# Patient Record
Sex: Female | Born: 1965 | Hispanic: No
Health system: Southern US, Community
[De-identification: ages and names within clinical notes are randomized; demographics above are authoritative.]

---

## 2004-02-08 ENCOUNTER — Emergency Department: Payer: Self-pay | Admitting: Emergency Medicine

## 2004-02-09 ENCOUNTER — Encounter: Payer: Self-pay | Admitting: Neurology

## 2020-11-30 ENCOUNTER — Other Ambulatory Visit: Payer: Self-pay | Admitting: Neurology

## 2020-11-30 ENCOUNTER — Other Ambulatory Visit (HOSPITAL_COMMUNITY): Payer: Self-pay | Admitting: Neurology

## 2020-11-30 DIAGNOSIS — R200 Anesthesia of skin: Secondary | ICD-10-CM

## 2020-11-30 DIAGNOSIS — R202 Paresthesia of skin: Secondary | ICD-10-CM

## 2020-12-06 ENCOUNTER — Other Ambulatory Visit: Payer: Self-pay

## 2020-12-06 ENCOUNTER — Ambulatory Visit
Admission: RE | Admit: 2020-12-06 | Discharge: 2020-12-06 | Disposition: A | Discharge status: Home / Self Care | Payer: Medicare Other | Source: Ambulatory Visit | Attending: Neurology | Admitting: Neurology

## 2020-12-06 ENCOUNTER — Ambulatory Visit: Payer: Medicare Other

## 2020-12-06 DIAGNOSIS — R202 Paresthesia of skin: Secondary | ICD-10-CM | POA: Insufficient documentation

## 2020-12-06 DIAGNOSIS — R200 Anesthesia of skin: Secondary | ICD-10-CM | POA: Diagnosis present

## 2020-12-06 IMAGING — MR MR HEAD WO/W CM
13 of 14 series · 39 of 48 positions shown · IV contrast (gadavist)
Comparison: No pertinent prior exams available for comparison.

CLINICAL DATA: Numbness and tingling R20.0, [TL] ([TL]-CM).
Additional history provided: Left-sided leg weakness with dizziness
for 10 months, progressively worsening.

EXAM:
MRI HEAD WITHOUT AND WITH CONTRAST
MRI CERVICAL SPINE WITHOUT AND WITH CONTRAST
TECHNIQUE: Multiplanar, multiecho pulse sequences of the brain and surrounding
structures, and cervical spine, to include the craniocervical
junction and cervicothoracic junction, were obtained without and
with intravenous contrast.
CONTRAST:  7.5mL GADAVIST GADOBUTROL 1 MMOL/ML IV SOLN

[Series 5: ax dwi_tracew · axial · 3.0mm · 0.65mm/px · z∈[-81,+72]mm · 4 of 48 slices shown]
[im 1/48]
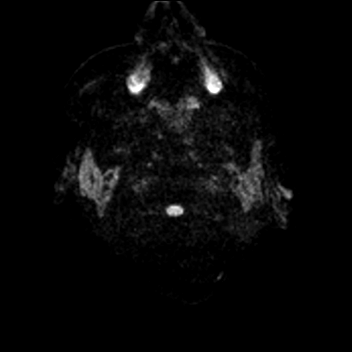
[im 16/48]
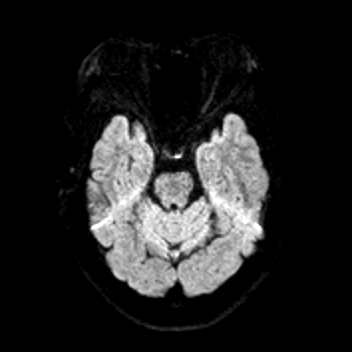
[im 32/48]
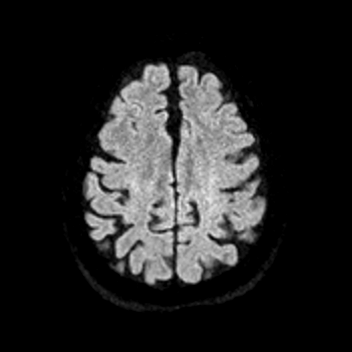
[im 48/48]
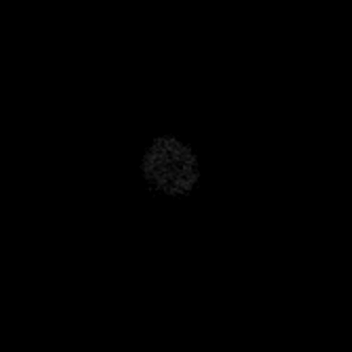

[Series 6: ax dwi_adc · axial · 3.0mm · 0.65mm/px · z∈[-81,+72]mm · 3 of 48 slices shown]
[im 1/48]
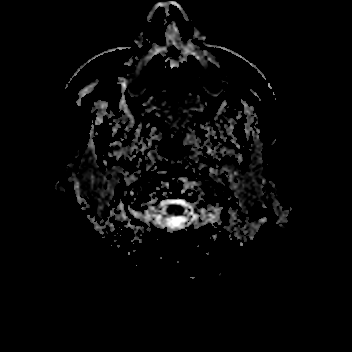
[im 24/48]
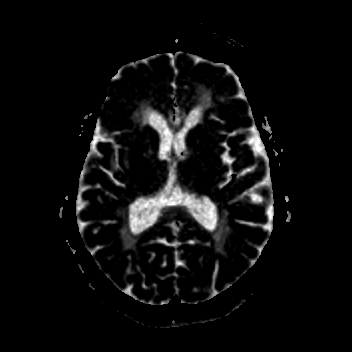
[im 48/48]
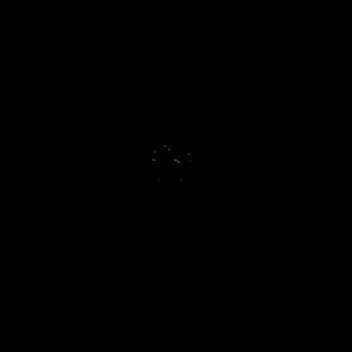

[Series 7: cor dwi_tracew · coronal · 5.0mm · 0.60mm/px · 2 of 38 slices shown]
[im 1/38]
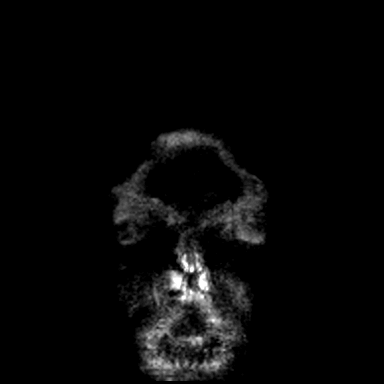
[im 38/38]
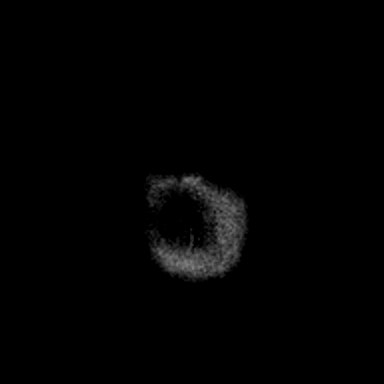

[Series 8: cor dwi_adc · coronal · 5.0mm · 0.60mm/px · 2 of 38 slices shown]
[im 1/38]
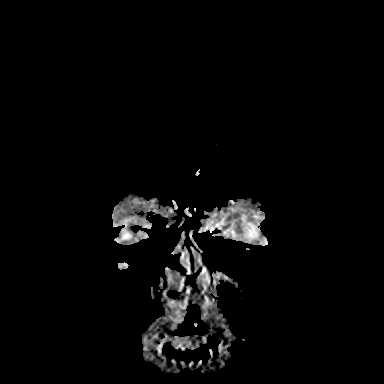
[im 38/38]
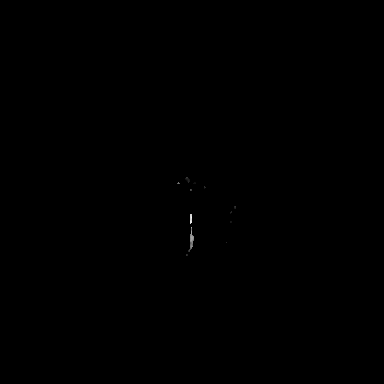

[Series 9: T1 · sagittal · 5.0mm · 0.62mm/px · 1 of 23 slices shown (1 of 2)]
[im 1/23]
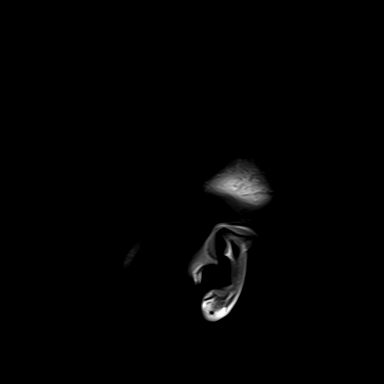

[Series 10: FLAIR · sagittal · 5.0mm · 0.94mm/px · 1 of 23 slices shown (1 of 2)]
[im 1/23]
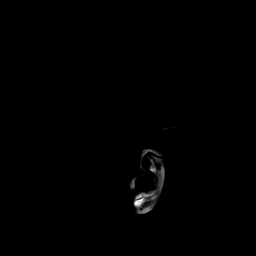

[Series 11: T2 · axial · 5.0mm · 0.53mm/px · z∈[-81,+73]mm · 2 of 27 slices shown]
[im 1/27]
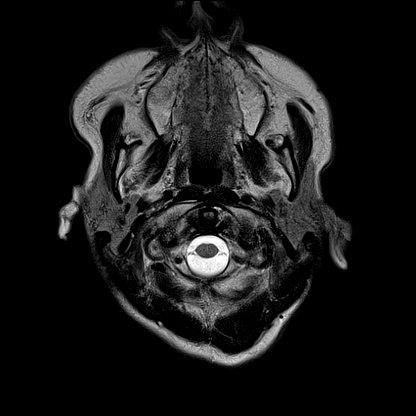
[im 27/27]
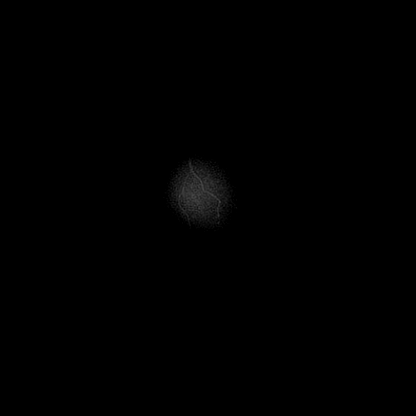

[Series 13: pha_images · axial · 3.0mm · 0.90mm/px · 1 of 52 slices shown]
[im 1/52]
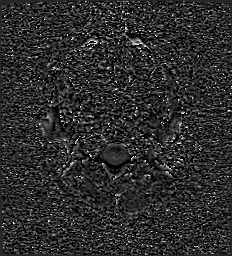

[Series 16: FLAIR · axial · 3.0mm · 0.53mm/px · z∈[-84,+76]mm · 3 of 55 slices shown (2 of 2)]
[im 1/55]
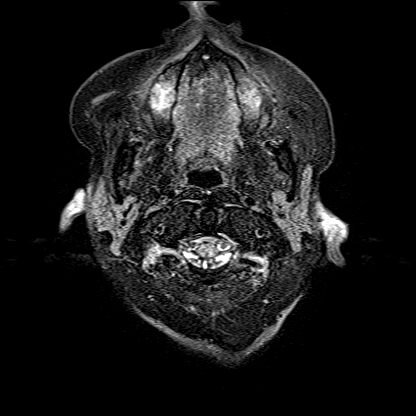
[im 28/55]
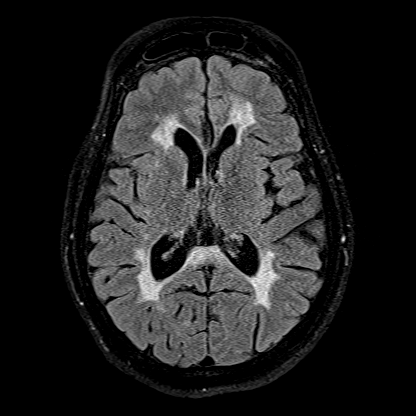
[im 55/55]
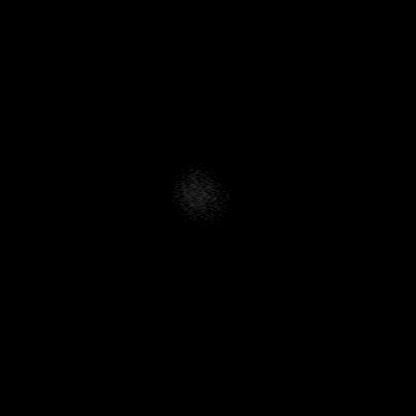

[Series 17: T1 · axial · 1.0mm · 0.98mm/px · z∈[-85,+72]mm · 8 of 160 slices shown (2 of 2)]
[im 1/160]
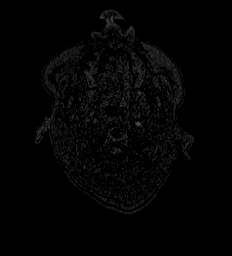
[im 18/160]
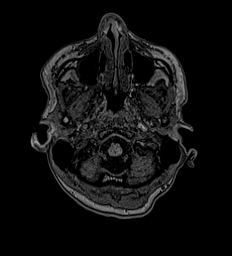
[im 54/160]
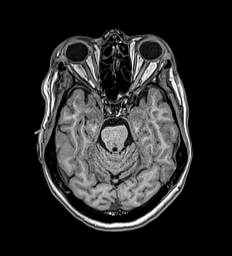
[im 71/160]
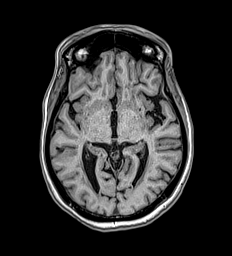
[im 89/160]
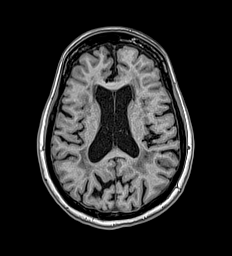
[im 107/160]
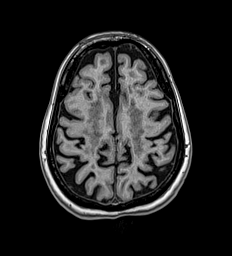
[im 142/160]
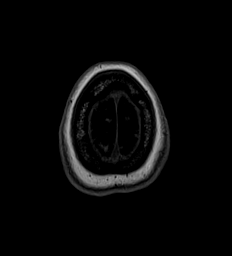
[im 160/160]
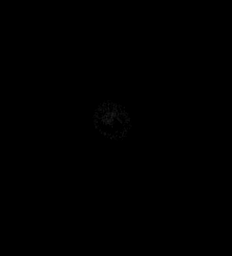

[Series 22: T2 post-contrast · coronal · 5.0mm · 0.57mm/px · 2 of 29 slices shown]
[im 1/29]
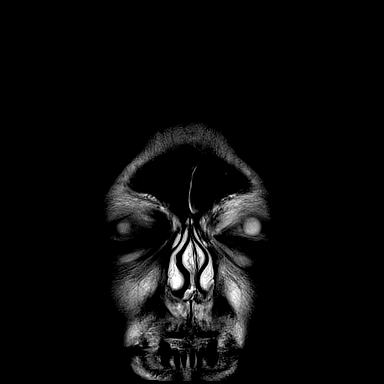
[im 29/29]
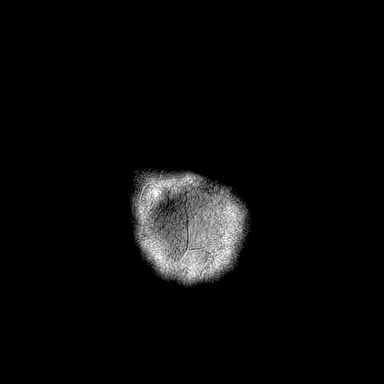

[Series 23: T1 post-contrast · axial · 1.0mm · 0.98mm/px · z∈[-85,+72]mm · 8 of 160 slices shown (1 of 2)]
[im 1/160]
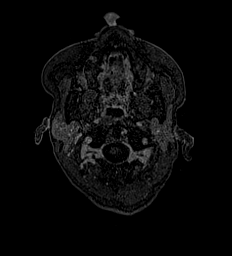
[im 18/160]
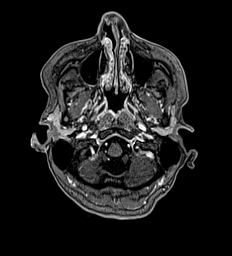
[im 54/160]
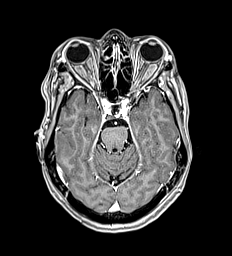
[im 71/160]
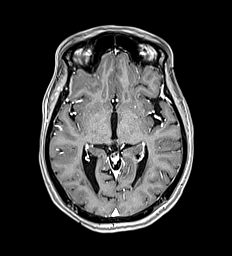
[im 89/160]
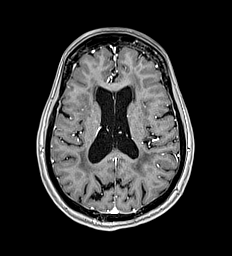
[im 107/160]
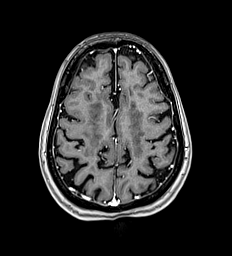
[im 142/160]
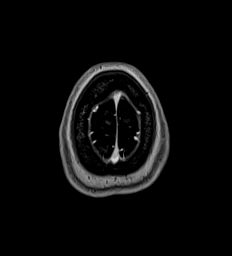
[im 160/160]
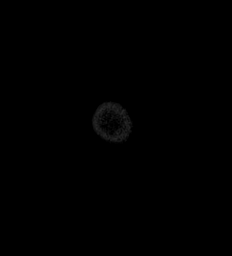

[Series 24: T1 post-contrast · coronal · 5.0mm · 0.57mm/px · 2 of 29 slices shown (2 of 2)]
[im 1/29]
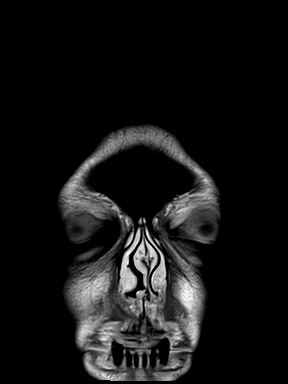
[im 29/29]
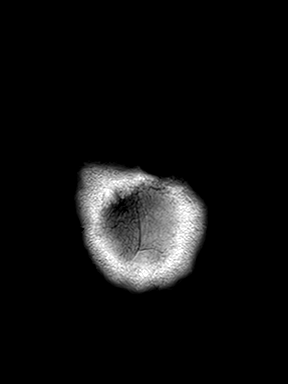

[39 of 48 positions shown; findings below may reference images not displayed]

FINDINGS: MRI HEAD FINDINGS

Brain:

Mild-to-moderate generalized cerebral atrophy, advanced for age.

Severe patchy and confluent T2/FLAIR hyperintensity within the
cerebral white matter. Multifocal T2/FLAIR hyperintense signal
abnormality is also present within the corpus callosum. Additional
subcentimeter focus of T2/FLAIR hyperintense signal abnormality
within the left cerebellar white matter (series 16, image 13).

There is no acute infarct.

No evidence of an intracranial mass.

No chronic intracranial blood products.

No extra-axial fluid collection.

No midline shift.

No abnormal intracranial enhancement.

Vascular: Expected proximal arterial flow voids.

Skull and upper cervical spine: No focal marrow lesion.

Sinuses/Orbits: Visualized orbits show no acute finding. Trace
bilateral frontal, ethmoid and maxillary sinus mucosal thickening.

MRI CERVICAL SPINE FINDINGS

Alignment: Cervical levocurvature. Mild reversal of the expected
cervical lordosis. Trace C4-C5 grade 1 anterolisthesis. Trace C5-C6
grade 1 retrolisthesis.

Vertebrae: Vertebral body height is maintained. Mild scattered edema
within the posterior elements, likely degenerative and related to
facet arthrosis. Elsewhere, no significant marrow edema or focal
suspicious osseous lesion is identified.

Cord: No spinal cord signal abnormality is identified. No abnormal
cord enhancement.

Posterior Fossa, vertebral arteries, paraspinal tissues: Posterior
fossa better assessed on concurrently performed brain MRI. Flow
voids preserved within the imaged cervical vertebral arteries.
Paraspinal soft tissues unremarkable.

Disc levels:

Moderate disc degeneration at C5-C6 and C6-C7. No more than mild
disc degeneration at the remaining levels.

C2-C3: Shallow disc bulge. Minimal facet arthrosis. No significant
spinal canal or foraminal stenosis.

C3-C4: Shallow disc bulge. Facet arthrosis. Uncovertebral
hypertrophy on the left. No significant spinal canal stenosis.
Minimal relative left neural foraminal narrowing.

C4-C5: Trace grade 1 anterolisthesis. Slight disc uncovering and
disc bulge. Uncovertebral hypertrophy on the right. Facet arthrosis.
No significant spinal canal or foraminal stenosis.

C5-C6: Disc bulge with right greater than left disc osteophyte
ridge/uncinate hypertrophy. Facet arthrosis. Mild relative spinal
canal narrowing without spinal cord mass effect. Moderate right
neural foraminal narrowing.

C6-C7: Posterior disc osteophyte complex with right greater than
left disc osteophyte ridge/uncinate hypertrophy. Minimal facet
arthrosis. Minimal partial effacement of the ventral thecal sac
without spinal cord mass effect. Severe right neural foraminal
narrowing.

C7-T1: Disc bulge eccentric to the right. Mild facet arthrosis. No
significant spinal canal stenosis. Mild relative right neural
foraminal narrowing.
IMPRESSION: MRI brain:

1. Severe patchy and confluent T2/FLAIR hyperintense signal changes
within the cerebral white matter. T2/FLAIR hyperintense signal
changes are also present within the corpus callosum and left
cerebellar white matter. Findings are nonspecific but suspicious for
a demyelinating process (such as multiple sclerosis). Markedly
age-advanced chronic small vessel ischemic disease is also a
consideration.
2. Mild-to-moderate generalized cerebral atrophy, advanced for age.

MRI cervical spine:

1. No appreciable signal abnormality within the cervical spinal
cord. No abnormal spinal cord enhancement.
2. Cervical spondylosis, as outlined.
3. No more than mild spinal canal stenosis.
4. Multilevel neural foraminal narrowing, as detailed and greatest
on the right at C5-C6 (moderate) and on the right at C6-C7 (severe).
5. Disc degeneration is greatest at C5-C6 and C6-C7 (moderate in
severity at these levels).
6. Reversal of the expected cervical lordosis.
7. Cervical levocurvature.
8. Trace multilevel grade 1 spondylolisthesis.

## 2020-12-06 IMAGING — MR MR CERVICAL SPINE WO/W CM
5 of 8 series · 26 of 48 positions shown · IV contrast (gadavist)
Comparison: No pertinent prior exams available for comparison.

CLINICAL DATA: Numbness and tingling R20.0, [TL] ([TL]-CM).
Additional history provided: Left-sided leg weakness with dizziness
for 10 months, progressively worsening.

EXAM:
MRI HEAD WITHOUT AND WITH CONTRAST
MRI CERVICAL SPINE WITHOUT AND WITH CONTRAST
TECHNIQUE: Multiplanar, multiecho pulse sequences of the brain and surrounding
structures, and cervical spine, to include the craniocervical
junction and cervicothoracic junction, were obtained without and
with intravenous contrast.
CONTRAST:  7.5mL GADAVIST GADOBUTROL 1 MMOL/ML IV SOLN

[Series 1: T2 · sagittal · 3.0mm · 0.62mm/px · 4 of 15 slices shown (1 of 2)]
[im 1/15]
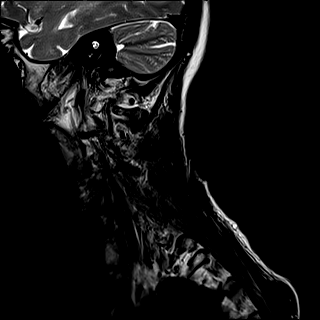
[im 5/15]
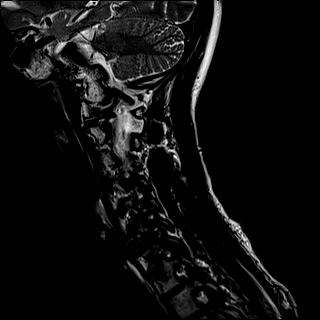
[im 10/15]
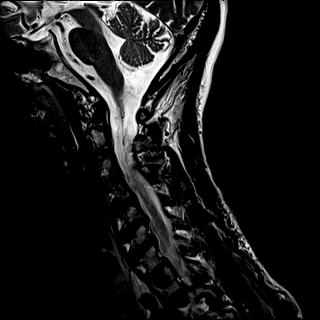
[im 15/15]
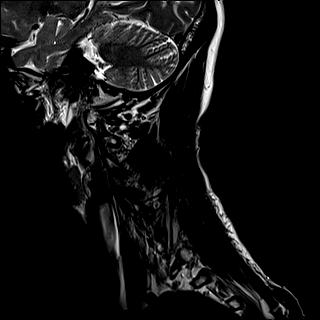

[Series 3: STIR · sagittal · 3.0mm · 0.62mm/px · 4 of 15 slices shown]
[im 1/15]
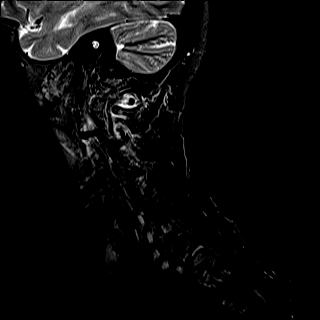
[im 5/15]
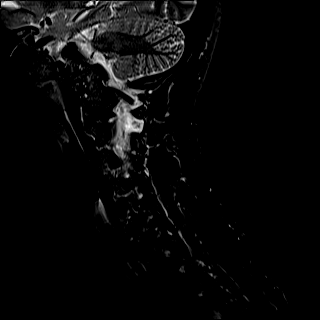
[im 10/15]
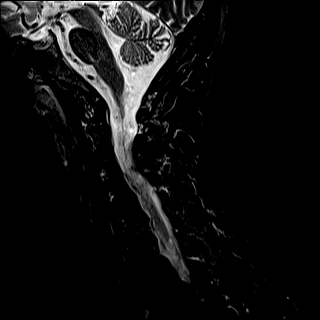
[im 15/15]
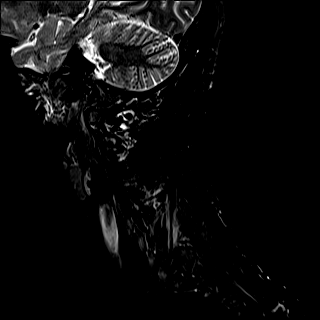

[Series 4: T2 · axial · 3.0mm · 0.70mm/px · z∈[-206,-124]mm · 8 of 29 slices shown (2 of 2)]
[im 1/29]
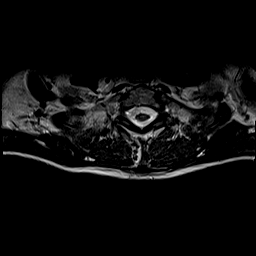
[im 5/29]
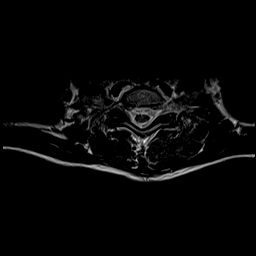
[im 9/29]
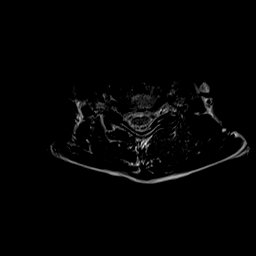
[im 13/29]
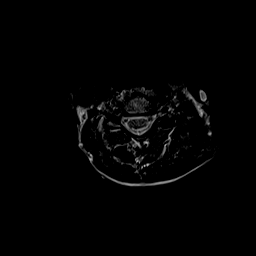
[im 17/29]
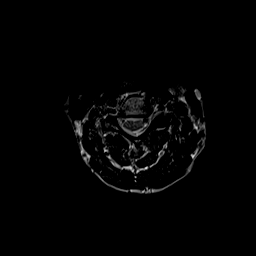
[im 21/29]
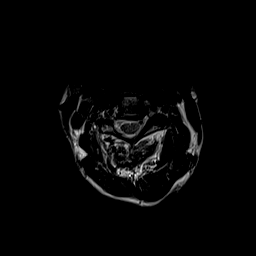
[im 25/29]
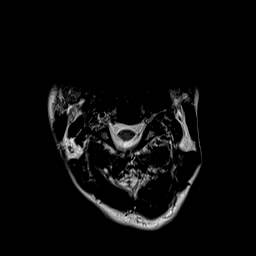
[im 29/29]
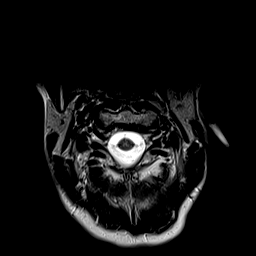

[Series 6: T1 · axial · non-contrast · 3.0mm · 0.35mm/px · z∈[-206,-124]mm · 8 of 29 slices shown]
[im 1/29]
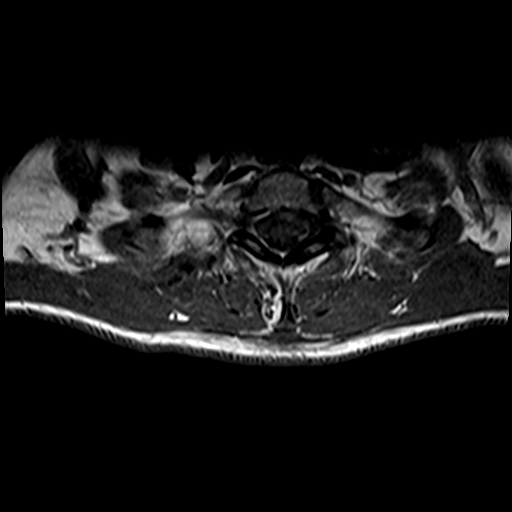
[im 5/29]
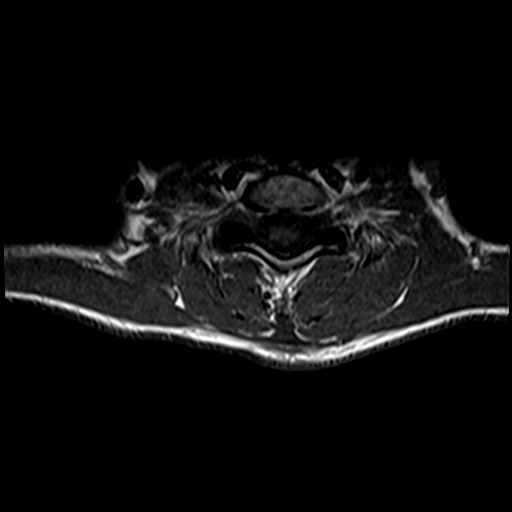
[im 9/29]
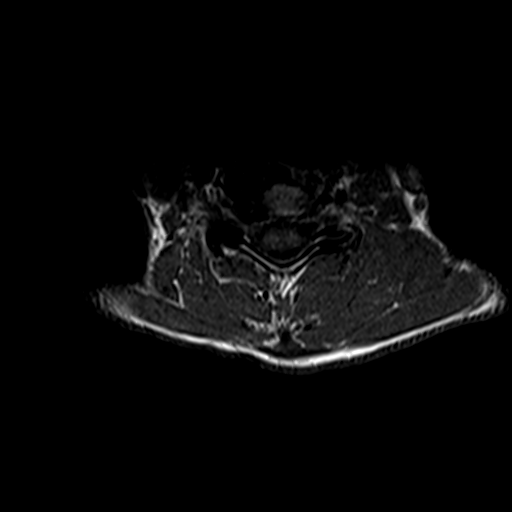
[im 13/29]
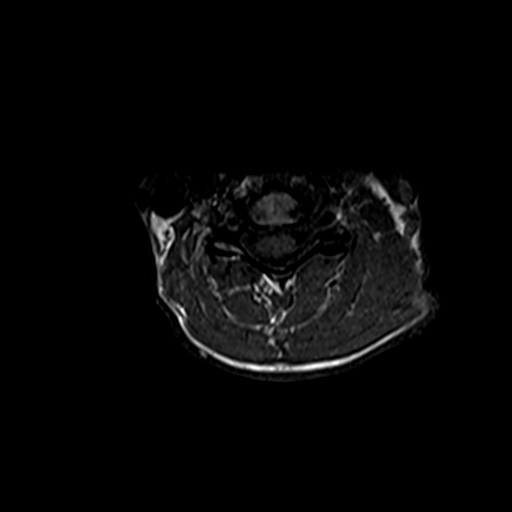
[im 17/29]
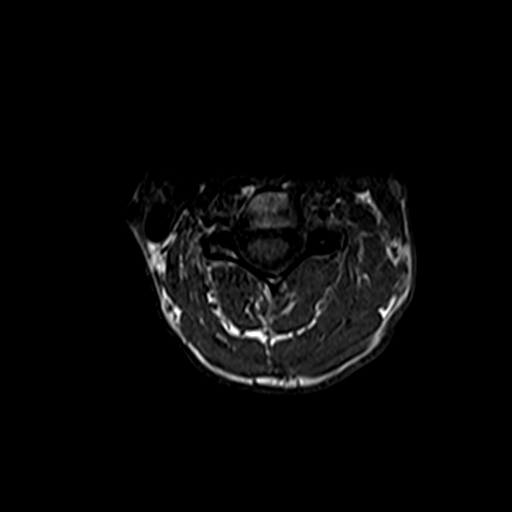
[im 21/29]
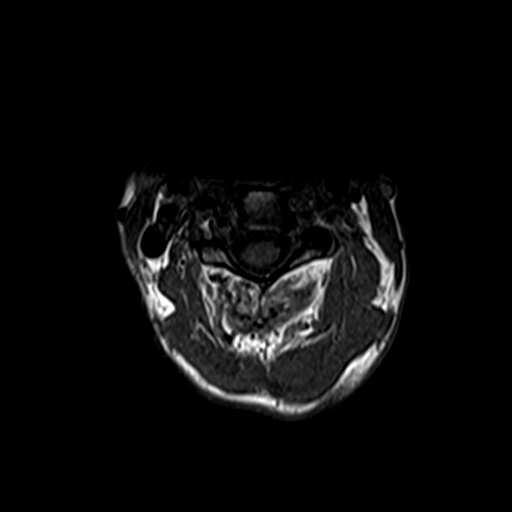
[im 25/29]
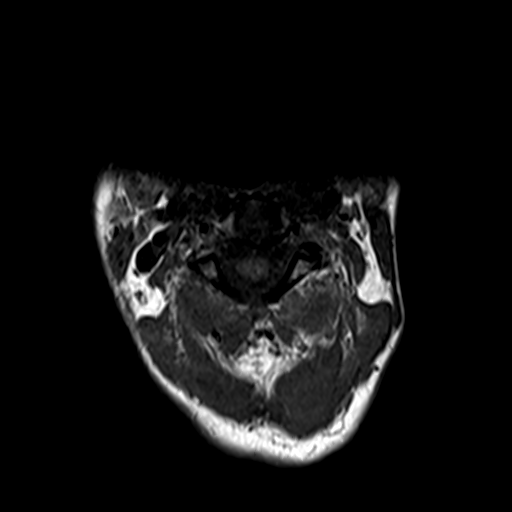
[im 29/29]
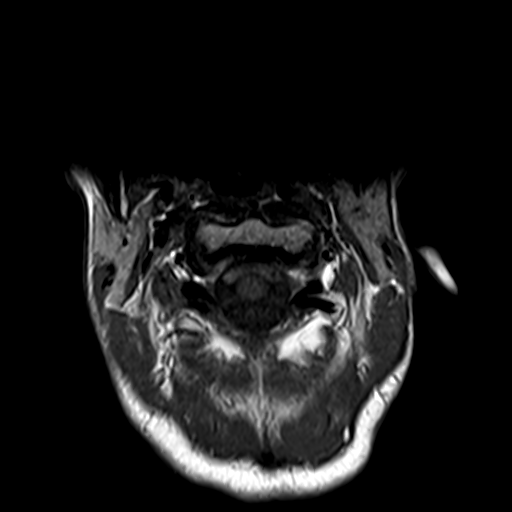

[Series 8: T1 post-contrast · axial · 3.0mm · 0.35mm/px · z∈[-206,-194]mm · 2 of 29 slices shown]
[im 1/29]
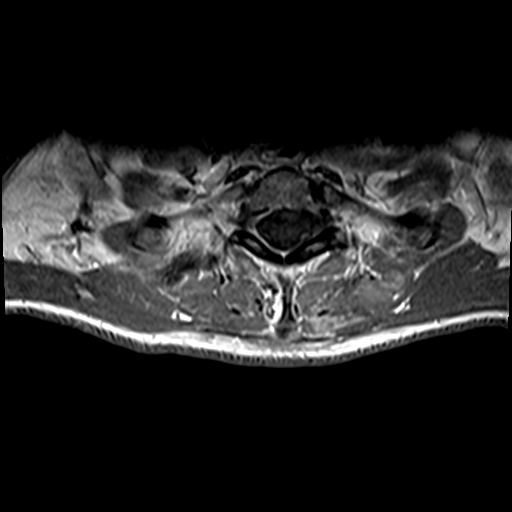
[im 5/29]
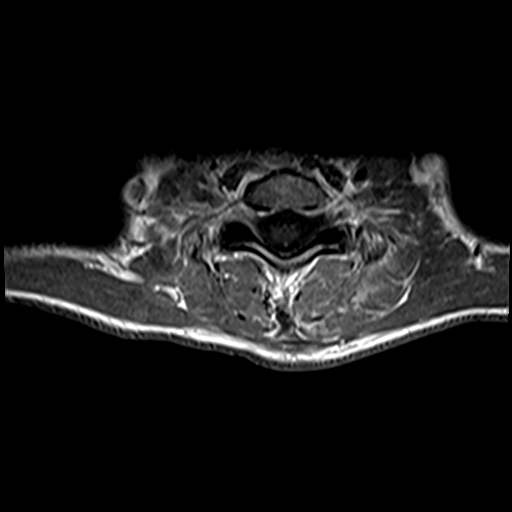

[26 of 48 positions shown; findings below may reference images not displayed]

FINDINGS: MRI HEAD FINDINGS

Brain:

Mild-to-moderate generalized cerebral atrophy, advanced for age.

Severe patchy and confluent T2/FLAIR hyperintensity within the
cerebral white matter. Multifocal T2/FLAIR hyperintense signal
abnormality is also present within the corpus callosum. Additional
subcentimeter focus of T2/FLAIR hyperintense signal abnormality
within the left cerebellar white matter (series 16, image 13).

There is no acute infarct.

No evidence of an intracranial mass.

No chronic intracranial blood products.

No extra-axial fluid collection.

No midline shift.

No abnormal intracranial enhancement.

Vascular: Expected proximal arterial flow voids.

Skull and upper cervical spine: No focal marrow lesion.

Sinuses/Orbits: Visualized orbits show no acute finding. Trace
bilateral frontal, ethmoid and maxillary sinus mucosal thickening.

MRI CERVICAL SPINE FINDINGS

Alignment: Cervical levocurvature. Mild reversal of the expected
cervical lordosis. Trace C4-C5 grade 1 anterolisthesis. Trace C5-C6
grade 1 retrolisthesis.

Vertebrae: Vertebral body height is maintained. Mild scattered edema
within the posterior elements, likely degenerative and related to
facet arthrosis. Elsewhere, no significant marrow edema or focal
suspicious osseous lesion is identified.

Cord: No spinal cord signal abnormality is identified. No abnormal
cord enhancement.

Posterior Fossa, vertebral arteries, paraspinal tissues: Posterior
fossa better assessed on concurrently performed brain MRI. Flow
voids preserved within the imaged cervical vertebral arteries.
Paraspinal soft tissues unremarkable.

Disc levels:

Moderate disc degeneration at C5-C6 and C6-C7. No more than mild
disc degeneration at the remaining levels.

C2-C3: Shallow disc bulge. Minimal facet arthrosis. No significant
spinal canal or foraminal stenosis.

C3-C4: Shallow disc bulge. Facet arthrosis. Uncovertebral
hypertrophy on the left. No significant spinal canal stenosis.
Minimal relative left neural foraminal narrowing.

C4-C5: Trace grade 1 anterolisthesis. Slight disc uncovering and
disc bulge. Uncovertebral hypertrophy on the right. Facet arthrosis.
No significant spinal canal or foraminal stenosis.

C5-C6: Disc bulge with right greater than left disc osteophyte
ridge/uncinate hypertrophy. Facet arthrosis. Mild relative spinal
canal narrowing without spinal cord mass effect. Moderate right
neural foraminal narrowing.

C6-C7: Posterior disc osteophyte complex with right greater than
left disc osteophyte ridge/uncinate hypertrophy. Minimal facet
arthrosis. Minimal partial effacement of the ventral thecal sac
without spinal cord mass effect. Severe right neural foraminal
narrowing.

C7-T1: Disc bulge eccentric to the right. Mild facet arthrosis. No
significant spinal canal stenosis. Mild relative right neural
foraminal narrowing.
IMPRESSION: MRI brain:

1. Severe patchy and confluent T2/FLAIR hyperintense signal changes
within the cerebral white matter. T2/FLAIR hyperintense signal
changes are also present within the corpus callosum and left
cerebellar white matter. Findings are nonspecific but suspicious for
a demyelinating process (such as multiple sclerosis). Markedly
age-advanced chronic small vessel ischemic disease is also a
consideration.
2. Mild-to-moderate generalized cerebral atrophy, advanced for age.

MRI cervical spine:

1. No appreciable signal abnormality within the cervical spinal
cord. No abnormal spinal cord enhancement.
2. Cervical spondylosis, as outlined.
3. No more than mild spinal canal stenosis.
4. Multilevel neural foraminal narrowing, as detailed and greatest
on the right at C5-C6 (moderate) and on the right at C6-C7 (severe).
5. Disc degeneration is greatest at C5-C6 and C6-C7 (moderate in
severity at these levels).
6. Reversal of the expected cervical lordosis.
7. Cervical levocurvature.
8. Trace multilevel grade 1 spondylolisthesis.

## 2020-12-06 MED ORDER — GADOBUTROL 1 MMOL/ML IV SOLN
5.0000 mL | Freq: Once | INTRAVENOUS | Status: AC | PRN
  Administered 2020-12-06: 7.5 mL via INTRAVENOUS

## 2022-04-28 ENCOUNTER — Ambulatory Visit: Payer: Medicare Other | Admitting: Physical Therapy

## 2022-05-10 ENCOUNTER — Encounter: Payer: Non-veteran care | Admitting: Physical Therapy

## 2022-05-16 ENCOUNTER — Telehealth: Payer: Self-pay | Admitting: Physical Therapy

## 2022-05-16 ENCOUNTER — Ambulatory Visit: Payer: Medicare Other | Admitting: Physical Therapy

## 2022-05-16 NOTE — Telephone Encounter (Signed)
Called patient when she did not show up for her appointment scheduled at 11:15 this morning. Left VM notifying patient of missed visit and requesting a call back to reschedule.   Stacey Jenkins. Graylon Good, PT, DPT 05/16/22, 11:33 AM  [facility] [address] [place], [place] [address] P: [phone] I F: [phone]

## 2022-05-17 ENCOUNTER — Ambulatory Visit: Payer: Medicare Other | Admitting: Physical Therapy

## 2022-05-17 ENCOUNTER — Encounter: Payer: Non-veteran care | Admitting: Physical Therapy

## 2022-05-18 ENCOUNTER — Ambulatory Visit: Payer: Medicare Other | Attending: Neurology

## 2022-05-18 ENCOUNTER — Ambulatory Visit: Payer: Medicare Other | Admitting: Physical Therapy

## 2022-05-18 DIAGNOSIS — R2681 Unsteadiness on feet: Secondary | ICD-10-CM | POA: Diagnosis present

## 2022-05-18 DIAGNOSIS — R262 Difficulty in walking, not elsewhere classified: Secondary | ICD-10-CM | POA: Insufficient documentation

## 2022-05-18 NOTE — Therapy (Signed)
[facility] [address] [place], [address] Phone: [phone]   Fax:  [phone]  Physical Therapy Evaluation  Patient Details  Name: Stacey Jenkins MRN: [id number] Date of Birth: 1965/11/26 Referring Provider (PT): Jennings Books, MD (Neurology)   Encounter Date: 05/18/2022   PT End of Session - 05/18/22 1138     Visit Number 1    Number of Visits 12    Date for PT Re-Evaluation 06/29/22    Authorization Type [employer] Medicare    Authorization Time Period 05/18/22-06/29/22    Progress Note Due on Visit 10    PT Start Time 1115    PT Stop Time 1200    PT Time Calculation (min) 45 min    Equipment Utilized During Treatment Gait belt    Activity Tolerance Patient tolerated treatment well;No increased pain;Patient limited by fatigue    Behavior During Therapy [facility] for tasks assessed/performed             History reviewed. No pertinent past medical history.  History reviewed. No pertinent surgical history.  There were no vitals filed for this visit.    Subjective Assessment -      Subjective Stacey Jenkins is a 57yoF referred to OPPT for intermittent knee pain, chronic gait/balance impairment related to MS diagnosis. She as a history of MS >25 years with mostly LLE limitations.    Pertinent History [name] is a 57yoF diagnosed with MS at age 12, has some intermittent diplopia, some LUE paresthesia, LLE motor and sensory involvement mostly, used [facility] for several years, uses [facility] or [facility] as needed. Pt still drives, AMB in community, has local family support, no recent falls due to safety awareness and risk awareness. 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    Currently in Pain? No/denies   knee hurts with weather changes, typically feels better with A/ROM/ mobility               [facility] PT Assessment -       Assessment   Medical Diagnosis Left knee pain, LLE weakness in MS     Referring Provider (PT) Jennings Books, MD (Neurology)    Hand Dominance Right      Restrictions   Weight Bearing Restrictions No      Balance Screen   Has the patient fallen in the past 6 months No    Has the patient had a decrease in activity level because of a fear of falling?  No    Is the patient reluctant to leave their home because of a fear of falling?  No      Prior Function   Level of Independence Independent    Vocation On disability      Transfers   Five time sit to stand comments  15.51sec   hands free     Ambulation/Gait   Ambulation Distance (Feet) 330 Feet    Assistive device Large base quad cane    Gait Pattern Shuffle;Abducted - left   step-to gait, 2 point gait c RUE QC   Pre-Gait Activities 6MWT   consistent pace, no pain, no fatigue, no dyspnea     Standardized Balance Assessment   Standardized Balance Assessment Mini-BESTest;Timed Up and Go Test      Mini-BESTest   Sit To Stand Normal: Comes to stand without use of hands and stabilizes independently.    Rise to Toes Moderate: Heels  up, but not full range (smaller than when holding hands), OR noticeable instability for 3 s.    Stand on one leg (left) Severe: Unable    Stand on one leg (right) Severe: Unable    Stand on one leg - lowest score 0    Compensatory Stepping Correction - Forward No step, OR would fall if not caught, OR falls spontaneously.    Compensatory Stepping Correction - Backward No step, OR would fall if not caught, OR falls spontaneously.    Compensatory Stepping Correction - Left Lateral Severe: Falls, or cannot step    Compensatory Stepping Correction - Right Lateral Severe:  Falls, or cannot step    Stepping Corredtion Lateral - lowest score 0    Stance - Feet together, eyes open, firm surface  Normal: 30s    Stance - Feet together, eyes closed, foam surface  Normal: 30s   takes time to get established   Incline - Eyes Closed Moderate: Stands independently < 30s OR aligns with surface     Change in Gait Speed Severe: Unable to achieve significant change in walking speed AND signs of imbalance.    Walk with head turns - Horizontal Severe: performs head turns with imbalance    Walk with pivot turns Moderate:Turns with feet close SLOW (>4 steps) with good balance.    Step over obstacles Moderate: Steps over box but touches box OR displays cautious behavior by slowing gait.    Timed UP & GO with Dual Task Moderate: Dual Task affects either counting OR walking (>10%) when compared to the TUG without Dual Task.    Mini-BEST total score 11      Timed Up and Go Test   Normal TUG (seconds) 23.86    Cognitive TUG (seconds) 33.95   month sof year backwards               Objective measurements completed on examination: See above findings.       PT Education -     Education Details Role of PT in promoting basic actiivty    Person(s) Educated Patient    Methods Explanation    Comprehension Verbalized understanding;Returned demonstration              PT Short Term Goals -      PT SHORT TERM GOAL #1   Title Patient to demonstrate adherence to HEP for improved and community ambulation.    Baseline 05/18/2022: HEP assignment pending    Time 4    Period Weeks    Status New    Target Date 06/15/22      PT SHORT TERM GOAL #2   Title Patient demonstrated 5 times sit to stand in less than 13 seconds and 3    Baseline 05/18/2022: 15 seconds without hands    Time 4    Period Weeks    Status New    Target Date 06/15/22               PT Long Term Goals -      PT LONG TERM GOAL #1   Title Patient to demonstrate 3 point met or greater on mini best test to indicate reduced fall risk    Baseline 05/18/22: 11/28    Time 6    Period Weeks    Status New    Target Date 06/29/22      PT LONG TERM GOAL #2   Title Patient to demonstrate improved tug test less than 18 seconds to  indicate based risk of falls    Baseline Eval: 24 seconds no device    Time 6     Period Weeks    Status New    Target Date 06/29/22      PT LONG TERM GOAL #3   Title Patient to demonstrate 10 m walk test greater than 0.5 meters/sec to indicate treatment in stride length and reduction of shuffling    Baseline 05/18/22: 0.28 m/s    Time 6    Period Weeks    Status New    Target Date 06/29/22      PT LONG TERM GOAL #4   Title Patient did demonstrate 6-minute walk test distance greater than 500 feet to demonstrate improved capacity related in the community    Baseline eval: 347ft c RUE LBQC    Time 6    Period Weeks    Status New    Target Date 06/29/22                 Plan -     Clinical Impression Statement Pt reports to be near her recent baseline. Examination revealing of significant motor pattern deviations and balance deficits that empirically point to elevated risk of sustaining a fall, as well as difficulty performing mobility in the community in a safe capacity. Pt has done well to manage this risk through safe choices, activity modification, and use of DME when needed. Pt will benefit from skilled PT intervention to improve ability to perform IADL mobility and to reduce risk of falls.    Personal Factors and Comorbidities Past/Current Experience;Behavior Pattern;Time since onset of injury/illness/exacerbation    Examination-Activity Limitations Transfers;Squat;Stand    Examination-Participation Restrictions Community Activity;Driving    Stability/Clinical Decision Making Evolving/Moderate complexity    Clinical Decision Making High    Rehab Potential Excellent    PT Frequency 2x / week    PT Duration 6 weeks    PT Treatment/Interventions ADLs/Self Care Home Management;Electrical Stimulation;Cryotherapy;Moist Heat;Gait training;DME Instruction;Functional mobility training;Therapeutic activities;Therapeutic exercise;Balance training;Neuromuscular re-education;Patient/family education;Cognitive remediation;Prosthetic Training    PT Next Visit Plan  Additional tests and measures as indicated; static and dynamic balance interventions, establish exercises for home    PT Home Exercise Plan defer until able to be determined safe here in clinic    Consulted and Agree with Plan of Care Patient             Patient will benefit from skilled therapeutic intervention in order to improve the following deficits and impairments:  Abnormal gait, Decreased balance, Decreased mobility, Increased muscle spasms, Prosthetic Dependency, Improper body mechanics, Decreased range of motion, Decreased strength, Postural dysfunction  Visit Diagnosis: Difficulty in walking, not elsewhere classified - Plan: PT plan of care cert/re-cert  Unsteadiness on feet - Plan: PT plan of care cert/re-cert     Problem List There are no problems to display for this patient.  12:47 PM, 05/18/22 Rosamaria Lints, PT, DPT Physical Therapist - [place] [phone] (Office)   Milladore C, PT 05/18/2022, 12:47 PM  [facility] [address] [place], [address] Phone: [phone]   Fax:  [phone]  Name: Aleeha Boline Broussard MRN: [id number] Date of Birth: Aug 31, 1965

## 2022-05-24 ENCOUNTER — Ambulatory Visit: Payer: Medicare Other | Admitting: Physical Therapy

## 2022-05-24 ENCOUNTER — Encounter: Payer: Self-pay | Admitting: Physical Therapy

## 2022-05-24 VITALS — BP 145/82 | HR 58

## 2022-05-24 DIAGNOSIS — R2681 Unsteadiness on feet: Secondary | ICD-10-CM

## 2022-05-24 DIAGNOSIS — R262 Difficulty in walking, not elsewhere classified: Secondary | ICD-10-CM

## 2022-05-24 NOTE — Therapy (Signed)
OUTPATIENT PHYSICAL THERAPY TREATMENT NOTE   Patient Name: Stacey Jenkins MRN: [id number] DOB:[birth year], 57 y.o., female Today's Date: 05/24/2022  PCP: [place] PROVIDER: Jennings Books, MD (neurology)  END OF SESSION:  PT End of Session - 05/24/22 1723     Visit Number 2    Number of Visits 12    Date for PT Re-Evaluation 06/29/22    Authorization Type [employer] Medicare reporting period from 05/18/2022    Progress Note Due on Visit 10    PT Start Time 1600    PT Stop Time 1645    PT Time Calculation (min) 45 min    Equipment Utilized During Treatment Gait belt    Activity Tolerance Patient tolerated treatment well;No increased pain    Behavior During Therapy [facility] for tasks assessed/performed             History reviewed. No pertinent past medical history. History reviewed. No pertinent surgical history. There are no problems to display for this patient.   REFERRING DIAG: chronic pain in right knee (MD note says order Physical Therapy for ambulation/balance in a patient with multiple sclerosis).   THERAPY DIAG:  Difficulty in walking, not elsewhere classified  Unsteadiness on feet  Rationale for Evaluation and Treatment: Rehabilitation  PERTINENT HISTORY: Stacey Jenkins is a 55yoF diagnosed with MS at age 48, has some intermittent diplopia, some LUE paresthesia, LLE motor and sensory involvement mostly, used spring leaf AFO for several years, uses [facility] or [facility] as needed. Pt still drives, AMB in community, has local family support, no recent falls due to safety awareness and risk awareness. 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   SUBJECTIVE:   SUBJECTIVE STATEMENT: Patient reports she is feeling well today. She arrives with her quad cane. Reports no falls since last PT session. She likes to read and look at TV and movies for fun. She also likes to go out to eat. She would like to get walking better as her goal for  PT.   PAIN:  Are you having pain? NPRS 0/10  OBJECTIVE  Vitals:   05/24/22 1605  BP: (!) 145/82  Pulse: (!) 58  SpO2: 100%   OBSERVATION Patient holds head in rigtht sidebending and rotation.  Gait shuffling and short steps with quad cane L knee hyperextends to approx 20 degrees recurvatum that patient stands in passively, but she is able to stablize L knee with slight flexion when cued.   CERVICAL SPINE ROM AROM very limited in left rotation and left sidebending PROM WFL in left rotation and sidebending.   MUSCLE PERFORMANCE (MMT):  *Indicates pain 05/24/22 Date Date  Joint/Motion R/L R/L R/L  Hip     Flexion (L1, L2) 4+/3 / /  Extension (knee ext) / / /  Extension (knee flex) / / /  Abduction (seated) 4+/3+ / /  Knee     Extension (L3) 5/4+ / /  Flexion (S2) 4/4 / /  Ankle/Foot     Dorsiflexion (L4) 4+/4 / /  Plantarflexion (S1) 4+/4+ / /  Comments:  05/24/22: seated and with left AFO (springleaf) donned.   TODAY'S TREATMENT:   Therapeutic exercise: to centralize symptoms and improve ROM, strength, muscular endurance, and activity tolerance required for successful completion of functional activities.  - vitals measurement to get baseline prior to exercise (see above).  - NuStep level 4 using bilateral upper and lower extremities. Seat/handle setting 11/14. For improved  extremity mobility, muscular endurance, and activity tolerance; and to induce the analgesic effect of aerobic exercise, stimulate improved joint nutrition, and prepare body structures and systems for following interventions. x 6  minutes. Average SPM = 54. - sit to stand from 18 inch chair, 1x10 - sit to stand from 18 inch chair with overhead press, 2x10 with 4# DB in each hand.  - lateral stepping back and forth over small aerobic step with B UE support, 2x10 progressing to looking at feet less. SBA with gait belt on.  - forwards/backwards step over small aerobic step with U to B UE support, 1x10 each  foot forwards. CGA.  - seated cervical retraction and left cervical side bend, 1x10.  - Education on HEP including handout   Pt required multimodal cuing for proper technique and to facilitate improved neuromuscular control, strength, range of motion, and functional ability resulting in improved performance and form.  PATIENT EDUCATION: Education details: Exercise purpose/form. Self management techniques. Education on HEP including handout. Person educated: Patient Education method: Explanation, Demonstration, Tactile cues, Verbal cues, and Handouts Education comprehension: verbalized understanding, returned demonstration, verbal cues required, tactile cues required, and needs further education  HOME EXERCISE PROGRAM: Access Code: [email or web address] URL: [email or web address] Date: 05/24/2022 Prepared by: Norton Blizzard  Exercises - Sit to Stand Without Arm Support  - 1 x daily - 3 sets - 10 reps - Side Stepping with Counter Support  - 1 x daily - 3 sets - 10 reps - Seated Neck Sidebending ROM  - 1 x daily - 3 sets - 10 reps   PT Short Term Goals       PT SHORT TERM GOAL #1   Title Patient to demonstrate adherence to HEP for improved and community ambulation.    Baseline HEP assignment pending (05/18/2022); initial HEP provided at visit 2 (05/24/2022);    Time 4    Period Weeks    Status In-progress   Target Date 06/15/22      PT SHORT TERM GOAL #2   Title Patient demonstrated 5 times sit to stand in less than 13 seconds and 3    Baseline 15 seconds without hands (05/18/2022);   Time 4    Period  Weeks   Status In-progress   Target Date 06/15/22              PT Long Term Goals       PT LONG TERM GOAL #1   Title Patient to demonstrate 3 point met or greater on mini best test to indicate reduced fall risk    Baseline 11/28 (05/18/22);   Time 6    Period Weeks    Status In-progress   Target Date 06/29/22      PT LONG TERM GOAL #2   Title Patient to demonstrate  improved tug test less than 18 seconds to indicate based risk of falls    Baseline Eval: 24 seconds no device (05/18/22);   Time 6    Period Weeks    Status In progress   Target Date 06/29/22      PT LONG TERM GOAL #3   Title Patient to demonstrate 10 m walk test greater than 0.5 meters/sec to indicate treatment in stride length and reduction of shuffling    Baseline 0.28 m/s  (05/18/22);   Time 6    Period Weeks    Status In-progress   Target Date 06/29/22      PT LONG TERM GOAL #4   Title Patient did demonstrate  6-minute walk test distance greater than 500 feet to demonstrate improved capacity related in the community    Baseline eval: 326ft c RUE [facility]  (05/18/22);   Time 6    Period Weeks    Status In-progress   Target Date 06/29/22           Plan    Clinical Impression Statement Patient arrived with very slow shuffling gait with quad cane. However, she demonstrated much better than expected LE strength and ability to take large steps during the session. Patient provided with appropriate HEP. Pateint has significant passive recurvatum in L knee and might benefit from a brace to help limit further stretching out of the posterior knee capsule that could be related to worsening knee pain. Patient would benefit from continued management of limiting condition by skilled physical therapist to address remaining impairments and functional limitations to work towards stated goals and return to PLOF or maximal functional independence.     Personal Factors and Comorbidities Past/Current Experience;Behavior Pattern;Time since onset of injury/illness/exacerbation    Examination-Activity Limitations Transfers;Squat;Stand    Examination-Participation Restrictions Community Activity;Driving    Stability/Clinical Decision Making Evolving/Moderate complexity    Rehab Potential Excellent    PT Frequency 2x / week    PT Duration 6 weeks    PT Treatment/Interventions ADLs/Self Care Home  Management;Electrical Stimulation;Cryotherapy;Moist Heat;Gait training;DME Instruction;Functional mobility training;Therapeutic activities;Therapeutic exercise;Balance training;Neuromuscular re-education;Patient/family education;Cognitive remediation;Prosthetic Training    PT Next Visit Plan Additional tests and measures as indicated; static and dynamic balance interventions, establish exercises for home    PT Home Exercise Plan defer until able to be determined safe here in clinic    Consulted and Agree with Plan of Care Patient             Nancy Nordmann, PT, DPT 05/24/2022, 6:10 PM  [facility] [address] [place], [place] [address] P: [phone] I F: [phone]

## 2022-05-30 ENCOUNTER — Ambulatory Visit: Payer: Medicare Other | Admitting: Physical Therapy

## 2022-05-30 ENCOUNTER — Encounter: Payer: Self-pay | Admitting: Physical Therapy

## 2022-05-30 DIAGNOSIS — R262 Difficulty in walking, not elsewhere classified: Secondary | ICD-10-CM | POA: Diagnosis not present

## 2022-05-30 DIAGNOSIS — R2681 Unsteadiness on feet: Secondary | ICD-10-CM

## 2022-05-30 NOTE — Therapy (Signed)
OUTPATIENT PHYSICAL THERAPY TREATMENT NOTE   Patient Name: Stacey Jenkins MRN: [id number] DOB:Apr 03, 1966, 57 y.o., female Today's Date: 05/30/2022  PCP: [place] PROVIDER: Jennings Books, MD (neurology)  END OF SESSION:  PT End of Session - 05/30/22 1136     Visit Number 3    Number of Visits 12    Date for PT Re-Evaluation 06/29/22    Authorization Type [employer] Medicare reporting period from 05/18/2022    Progress Note Due on Visit 10    PT Start Time 1030    PT Stop Time 1115    PT Time Calculation (min) 45 min    Equipment Utilized During Treatment Gait belt    Activity Tolerance Patient tolerated treatment well;No increased pain    Behavior During Therapy [facility] for tasks assessed/performed             History reviewed. No pertinent past medical history. History reviewed. No pertinent surgical history. There are no problems to display for this patient.   REFERRING DIAG: chronic pain in right knee (MD note says order Physical Therapy for ambulation/balance in a patient with multiple sclerosis).   THERAPY DIAG:  Difficulty in walking, not elsewhere classified  Unsteadiness on feet  Rationale for Evaluation and Treatment: Rehabilitation  PERTINENT HISTORY: Stacey Jenkins is a 46yoF diagnosed with MS at age 55, has some intermittent diplopia, some LUE paresthesia, LLE motor and sensory involvement mostly, used spring leaf AFO for several years, uses [facility] or [facility] as needed. Pt still drives, AMB in community, has local family support, no recent falls due to safety awareness and risk awareness. 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   SUBJECTIVE:   SUBJECTIVE STATEMENT: Patient arrives with her quad cane. She states she felt okay after last PT session. She reports her R knee started hurting this morning and she expects it is from the weather. She states her HEP went well and she is doing them 3x a [place].   PAIN:  Are  you having pain? NPRS 6/10 over right patella.   OBJECTIVE  TODAY'S TREATMENT:   Gait Training: to improve gait mechanics, walking speed, and stability.  - ambulation over ground with quad cane in R UE with CGA, 2x15 agility ladder rungs, 3x7 six-inch hurdles, up and over 4 inch aerobic step, plus ~80 feet over flat ground. Patient able to take larger steps with horizontal lines in front and when focusing on talking large steps during ambulation over non-marked ground.  - ambulation over ground with quad cane in R UE and CGA while wearing 5#AW on each foot and focusing on taking large steps. 1x75 feet. Patient demo good step length when focused on it but weights did not seem to significantly change her ability.  - ambulation over ground with RW with YTB tied across front two legs with cuing as a target for legs to improve step length. 2x100 feet with CGA. Improved step length, required cuing for body position in the walker.  - ambulation over ground with rollator with YTB tied across back two legs with cuing as a target for legs to improve step length. 2x100 feet with CGA. Patient demo improved step length, especially when focused on walking towards theraband. Demo some difficulty keeping rollator straight with each step.   Therapeutic exercise: to centralize symptoms and improve ROM, strength, muscular endurance, and activity tolerance required for successful completion of functional activities.  - sit to  stand from 17 inch chair with overhead press, 3x10 with 6# DB in each hand. Added YTB around knees 2nd two sets due to valgus position of R > L especially in sitting.  - modified KB deadlift in front of chair with KB to 4 inch yoga block placed between feet, 3x10 - floor (prone) <> stand transfer with CGA-SBA to mat on floor. 1x5 progressing to lifting B UE overhead and hopping when returning to standing position. Patient able to complete with guarding noted above without use of external support. Had  trouble with hopping.   Pt required multimodal cuing for proper technique and to facilitate improved neuromuscular control, strength, range of motion, and functional ability resulting in improved performance and form.  PATIENT EDUCATION: Education details: Exercise purpose/form. Self management techniques. Education on HEP including handout. Person educated: Patient Education method: Explanation, Demonstration, Tactile cues, Verbal cues, and Handouts Education comprehension: verbalized understanding, returned demonstration, verbal cues required, tactile cues required, and needs further education  HOME EXERCISE PROGRAM: Access Code: [email or web address] URL: [email or web address] Date: 05/24/2022 Prepared by: Rosita Kea  Exercises - Sit to Stand Without Arm Support  - 1 x daily - 3 sets - 10 reps - Side Stepping with Counter Support  - 1 x daily - 3 sets - 10 reps - Seated Neck Sidebending ROM  - 1 x daily - 3 sets - 10 reps   PT Short Term Goals       PT SHORT TERM GOAL #1   Title Patient to demonstrate adherence to HEP for improved and community ambulation.    Baseline HEP assignment pending (05/18/2022); initial HEP provided at visit 2 (05/24/2022);    Time 4    Period Weeks    Status In-progress   Target Date 06/15/22      PT SHORT TERM GOAL #2   Title Patient demonstrated 5 times sit to stand in less than 13 seconds and 3    Baseline 15 seconds without hands (05/18/2022);   Time 4    Period  Weeks   Status In-progress   Target Date 06/15/22              PT Long Term Goals       PT LONG TERM GOAL #1   Title Patient to demonstrate 3 point met or greater on mini best test to indicate reduced fall risk    Baseline 11/28 (05/18/22);   Time 6    Period Weeks    Status In-progress   Target Date 06/29/22      PT LONG TERM GOAL #2   Title Patient to demonstrate improved tug test less than 18 seconds to indicate based risk of falls    Baseline Eval: 24 seconds no  device (05/18/22);   Time 6    Period Weeks    Status In progress   Target Date 06/29/22      PT LONG TERM GOAL #3   Title Patient to demonstrate 10 m walk test greater than 0.5 meters/sec to indicate treatment in stride length and reduction of shuffling    Baseline 0.28 m/s  (05/18/22);   Time 6    Period Weeks    Status In-progress   Target Date 06/29/22      PT LONG TERM GOAL #4   Title Patient did demonstrate 6-minute walk test distance greater than 500 feet to demonstrate improved capacity related in the community    Baseline eval: 318ft c RUE [facility]  (05/18/22);   Time 6  Period Weeks    Status In-progress   Target Date 06/29/22           Plan    Clinical Impression Statement Patient arrives with slow shuffling gait using quad cane in R UE. Session focused on improving gait pattern and assessing the use of various external cues to improve her step length. Patient did best with RW/rollator with theraband placed horizontally across walker legs to provide a target for moving patient's LE towards during gait or horizontal lines on the floor such as agility ladder or hurdles. Patient owns a rollator and plans to bring it next time to add theraband to it to improve her community mobility. Continued working on functional strength and balance including squats, dead lift, and floor transfers. Patient continues to struggle with LE alignment with squats and dead lifts and maintains head in right rotation/sidebent posture with minimal ability to correct with cuing. Patient did better than expected with floor transfer and would benefit from further practice to improve safety and functional abilities for fall recovery, daily life, and long term functional ability. Patient would benefit from continued management of limiting condition by skilled physical therapist to address remaining impairments and functional limitations to work towards stated goals and return to PLOF or maximal functional  independence.     Personal Factors and Comorbidities Past/Current Experience;Behavior Pattern;Time since onset of injury/illness/exacerbation    Examination-Activity Limitations Transfers;Squat;Stand    Examination-Participation Restrictions Community Activity;Driving    Stability/Clinical Decision Making Evolving/Moderate complexity    Rehab Potential Excellent    PT Frequency 2x / week    PT Duration 6 weeks    PT Treatment/Interventions ADLs/Self Care Home Management;Electrical Stimulation;Cryotherapy;Moist Heat;Gait training;DME Instruction;Functional mobility training;Therapeutic activities;Therapeutic exercise;Balance training;Neuromuscular re-education;Patient/family education;Cognitive remediation;Prosthetic Training    PT Next Visit Plan Additional tests and measures as indicated; static and dynamic balance interventions, establish exercises for home    PT Home Exercise Plan defer until able to be determined safe here in clinic    Consulted and Agree with Plan of Care Patient             Cira Rue, PT, DPT 05/30/2022, 11:50 AM  [facility] [address] [place], [place] [address] P: [phone] I F: [phone]

## 2022-06-01 ENCOUNTER — Ambulatory Visit: Payer: Medicare Other | Admitting: Physical Therapy

## 2022-06-01 ENCOUNTER — Encounter: Payer: Self-pay | Admitting: Physical Therapy

## 2022-06-01 DIAGNOSIS — R2681 Unsteadiness on feet: Secondary | ICD-10-CM

## 2022-06-01 DIAGNOSIS — R262 Difficulty in walking, not elsewhere classified: Secondary | ICD-10-CM

## 2022-06-01 NOTE — Therapy (Signed)
OUTPATIENT PHYSICAL THERAPY TREATMENT NOTE   Patient Name: Stacey Jenkins MRN: [id number] DOB:[birth year], 57 y.o., female Today's Date: 06/01/2022  PCP: [facility] PROVIDER: Jennings Books, MD (neurology)  END OF SESSION:  PT End of Session - 06/01/22 1323     Visit Number 4    Number of Visits 12    Date for PT Re-Evaluation 06/29/22    Authorization Type [employer] Medicare reporting period from 05/18/2022    Progress Note Due on Visit 10    PT Start Time 1035    PT Stop Time 1115    PT Time Calculation (min) 40 min    Equipment Utilized During Treatment Gait belt    Activity Tolerance Patient tolerated treatment well;No increased pain    Behavior During Therapy Stacey Jenkins for tasks assessed/performed              History reviewed. No pertinent past medical history. History reviewed. No pertinent surgical history. There are no problems to display for this patient.   REFERRING DIAG: chronic pain in right knee (MD note says order Physical Therapy for ambulation/balance in a patient with multiple sclerosis).   THERAPY DIAG:  Difficulty in walking, not elsewhere classified  Unsteadiness on feet  Rationale for Evaluation and Treatment: Rehabilitation  PERTINENT HISTORY: Stacey Jenkins is a 55yoF diagnosed with MS at age 20, has some intermittent diplopia, some LUE paresthesia, LLE motor and sensory involvement mostly, used [employer] A[facility] for several years, uses [facility] or [facility] as needed. Pt still drives, AMB in community, has local family support, no recent falls due to safety awareness and risk awareness. 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   SUBJECTIVE:   SUBJECTIVE STATEMENT: Patient arrives with rollator walker. She reports she felt okay after last PT session. She has been working on her HEP and has no questions about them.   PAIN:  Are you having pain? NPRS 0/10  OBJECTIVE  6MWT: 536 feet with rollater with yellow  theraband on it to encourage larger steps.   TODAY'S TREATMENT:   Therapeutic exercise: to centralize symptoms and improve ROM, strength, muscular endurance, and activity tolerance required for successful completion of functional activities.  - .ambulation over ground for distance in 6 minutes with rollater with yellow theraband stretched across back legs to encourage larger steps.  - sit to stand from 17 inch chair with overhead press, 3x10 with 8/6/6# DB in each hand. Added YTB around knees to help improve valgus position of R > L especially in sitting.  - lateral stepping back and forth over 6-inch small aerobic step with 3#AW on each ankle, 2x10 each side, first set with heavy UE support, 2nd set with lighter UE support. (Try 5# next time) - modified KB deadlift in front of chair with KB to 4 inch yoga block placed between feet, 3x10 with 20#KB.  - floor (prone) <> stand transfer with CGA-SBA to mat on floor. 1x5 with lifting B UE overhead when returning to standing position. Patient able to complete with guarding noted above without use of external support, but finds it fatiguing.   Pt required multimodal cuing for proper technique and to facilitate improved neuromuscular control, strength, range of motion, and functional ability resulting in improved performance and form.  PATIENT EDUCATION: Education details: Exercise purpose/form. Self management techniques. Education on HEP including handout. Person educated: Patient Education method: Explanation, Demonstration, Tactile cues, Verbal cues, and Handouts Education comprehension:  verbalized understanding, returned demonstration, verbal cues required, tactile cues required, and needs further education  HOME EXERCISE PROGRAM: Access Code: [email or web address] URL: [email or web address] Date: 05/24/2022 Prepared by: Norton Blizzard  Exercises - Sit to Stand Without Arm Support  - 1 x daily - 3 sets - 10 reps - Side Stepping with Counter  Support  - 1 x daily - 3 sets - 10 reps - Seated Neck Sidebending ROM  - 1 x daily - 3 sets - 10 reps   PT Short Term Goals       PT SHORT TERM GOAL #1   Title Patient to demonstrate adherence to HEP for improved and community ambulation.    Baseline HEP assignment pending (05/18/2022); initial HEP provided at visit 2 (05/24/2022);    Time 4    Period Weeks    Status In-progress   Target Date 06/15/22      PT SHORT TERM GOAL #2   Title Patient demonstrated 5 times sit to stand in less than 13 seconds and 3    Baseline 15 seconds without hands (05/18/2022);   Time 4    Period  Weeks   Status In-progress   Target Date 06/15/22              PT Long Term Goals       PT LONG TERM GOAL #1   Title Patient to demonstrate 3 point met or greater on mini best test to indicate reduced fall risk    Baseline 11/28 (05/18/22);   Time 6    Period Weeks    Status In-progress   Target Date 06/29/22      PT LONG TERM GOAL #2   Title Patient to demonstrate improved tug test less than 18 seconds to indicate based risk of falls    Baseline Eval: 24 seconds no device (05/18/22);   Time 6    Period Weeks    Status In progress   Target Date 06/29/22      PT LONG TERM GOAL #3   Title Patient to demonstrate 10 m walk test greater than 0.5 meters/sec to indicate treatment in stride length and reduction of shuffling    Baseline 0.28 m/s  (05/18/22);   Time 6    Period Weeks    Status In-progress   Target Date 06/29/22      PT LONG TERM GOAL #4   Title Patient did demonstrate 6-minute walk test distance greater than 500 feet to demonstrate improved capacity related in the community    Baseline eval: 378ft c RUE [facility]  (05/18/22); 536 feet with rollater with yellow theraband on it to encourage larger steps (06/01/2022);    Time 6    Period Weeks    Status MET   Target Date 06/29/22           Plan    Clinical Impression Statement Patient arrives with improved gait while using rollator.  Today's session focused on continued functional strengthening, balance exercises, and interventions to improve step length. Patient tolerated treatment well with no report of increased pain. Patient was able to meet her 6 Minute Walk Goal while using rollator with yellow theraband tied across back two legs for help in taking larger steps. Patient continues to struggle with head posture and taking shuffling steps when not thinking about making them larger. Patient would benefit from continued management of limiting condition by skilled physical therapist to address remaining impairments and functional limitations to work towards stated goals and return to PLOF or  maximal functional independence.     Personal Factors and Comorbidities Past/Current Experience;Behavior Pattern;Time since onset of injury/illness/exacerbation    Examination-Activity Limitations Transfers;Squat;Stand    Examination-Participation Restrictions Community Activity;Driving    Stability/Clinical Decision Making Evolving/Moderate complexity    Rehab Potential Excellent    PT Frequency 2x / week    PT Duration 6 weeks    PT Treatment/Interventions ADLs/Self Care Home Management;Electrical Stimulation;Cryotherapy;Moist Heat;Gait training;DME Instruction;Functional mobility training;Therapeutic activities;Therapeutic exercise;Balance training;Neuromuscular re-education;Patient/family education;Cognitive remediation;Prosthetic Training    PT Next Visit Plan Additional tests and measures as indicated; static and dynamic balance interventions, establish exercises for home    Consulted and Agree with Plan of Care Patient             Nancy Nordmann, PT, DPT 06/01/2022, 1:28 PM  [facility] [address] [place], [place] [address] P: [phone] I F: [phone]

## 2022-06-06 ENCOUNTER — Ambulatory Visit: Payer: Medicare Other | Admitting: Physical Therapy

## 2022-06-06 DIAGNOSIS — R2681 Unsteadiness on feet: Secondary | ICD-10-CM

## 2022-06-06 DIAGNOSIS — R262 Difficulty in walking, not elsewhere classified: Secondary | ICD-10-CM | POA: Diagnosis not present

## 2022-06-06 NOTE — Therapy (Signed)
OUTPATIENT PHYSICAL THERAPY TREATMENT NOTE   Patient Name: Stacey Jenkins MRN: [id number] DOB:[birth year], 57 y.o., female Today's Date: 06/06/2022  PCP: [facility] PROVIDER: Jennings Books, MD (neurology)  END OF SESSION:  PT End of Session - 06/06/22 1033     Visit Number 5    Number of Visits 12    Date for PT Re-Evaluation 06/29/22    Authorization Type [employer] Medicare reporting period from 05/18/2022    Progress Note Due on Visit 10    PT Start Time 1037   patient late   PT Stop Time 1115    PT Time Calculation (min) 38 min    Equipment Utilized During Treatment Gait belt    Activity Tolerance Patient tolerated treatment well;No increased pain    Behavior During Therapy [facility] for tasks assessed/performed               No past medical history on file. No past surgical history on file. There are no problems to display for this patient.   REFERRING DIAG: chronic pain in right knee (MD note says order Physical Therapy for ambulation/balance in a patient with multiple sclerosis).   THERAPY DIAG:  Difficulty in walking, not elsewhere classified  Unsteadiness on feet  Rationale for Evaluation and Treatment: Rehabilitation  PERTINENT HISTORY: Stacey Jenkins is a 16yoF diagnosed with MS at age 43, has some intermittent diplopia, some LUE paresthesia, LLE motor and sensory involvement mostly, used [employer] A[facility] for several years, uses [facility] or [facility] as needed. Pt still drives, AMB in community, has local family support, no recent falls due to safety awareness and risk awareness. 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   SUBJECTIVE:   SUBJECTIVE STATEMENT: Patient arrives with rollator walker with yellow theraband on the back legs. She reports she felt okay after last PT session. She reports no falls since last PT session. She states she is doing her HEP 3x a [place].   PAIN:  Are you having pain? NPRS 4-5/10 R  knee  OBJECTIVE  TODAY'S TREATMENT:   Neuromuscular Re-education: to improve, balance, postural strength, muscle activation patterns, and stabilization strength required for functional activities: - ambulation over obstacle course with each round consisting of 8 six-inch hurdles, one 5 foot airex beam, 1 step up and over 6 inches high, 2 cones to weave through and 100 feet of walking. Patient completed twice with CGA with 5#AW on each foot with quad cane.  - ambulation over 8 six-inch hurdles and along 5 foot airex pad with quad cane, B 5# AW, CGA. 4 times through.  - .ambulation over ground for fopr speed with rollater with yellow theraband stretched across back legs to encourage larger steps and B 5#AW, 1x200 feet with SBA.   Therapeutic exercise: to centralize symptoms and improve ROM, strength, muscular endurance, and activity tolerance required for successful completion of functional activities.  - sit to stand from 17 inch chair with overhead press, 3x10 with 6# DB in each hand. Added YTB around knees to help improve valgus position of R > L especially in sitting.  - modified KB deadlift in front of chair with KB to 4 inch yoga block placed between feet, 3x10 with 20/30/20#KB. (Last set all the way to the floor).   Pt required multimodal cuing for proper technique and to facilitate improved neuromuscular control, strength, range of motion, and functional ability resulting in improved performance and form.  PATIENT EDUCATION: Education details: Exercise purpose/form. Self management techniques. Education on HEP including handout. Person educated: Patient Education method: Explanation, Demonstration, Tactile cues, Verbal cues, and Handouts Education comprehension: verbalized understanding, returned demonstration, verbal cues required, tactile cues required, and needs further education  HOME EXERCISE PROGRAM: Access Code: [email or web address] URL: [email or web address] Date:  05/24/2022 Prepared by: Rosita Kea  Exercises - Sit to Stand Without Arm Support  - 1 x daily - 3 sets - 10 reps - Side Stepping with Counter Support  - 1 x daily - 3 sets - 10 reps - Seated Neck Sidebending ROM  - 1 x daily - 3 sets - 10 reps   PT Short Term Goals       PT SHORT TERM GOAL #1   Title Patient to demonstrate adherence to HEP for improved and community ambulation.    Baseline HEP assignment pending (05/18/2022); initial HEP provided at visit 2 (05/24/2022);    Time 4    Period Weeks    Status In-progress   Target Date 06/15/22      PT SHORT TERM GOAL #2   Title Patient demonstrated 5 times sit to stand in less than 13 seconds and 3    Baseline 15 seconds without hands (05/18/2022);   Time 4    Period  Weeks   Status In-progress   Target Date 06/15/22              PT Long Term Goals       PT LONG TERM GOAL #1   Title Patient to demonstrate 3 point met or greater on mini best test to indicate reduced fall risk    Baseline 11/28 (05/18/22);   Time 6    Period Weeks    Status In-progress   Target Date 06/29/22      PT LONG TERM GOAL #2   Title Patient to demonstrate improved tug test less than 18 seconds to indicate based risk of falls    Baseline Eval: 24 seconds no device (05/18/22);   Time 6    Period Weeks    Status In progress   Target Date 06/29/22      PT LONG TERM GOAL #3   Title Patient to demonstrate 10 m walk test greater than 0.5 meters/sec to indicate treatment in stride length and reduction of shuffling    Baseline 0.28 m/s  (05/18/22);   Time 6    Period Weeks    Status In-progress   Target Date 06/29/22      PT LONG TERM GOAL #4   Title Patient did demonstrate 6-minute walk test distance greater than 500 feet to demonstrate improved capacity related in the community    Baseline eval: 354ft c RUE [facility]  (05/18/22); 536 feet with rollater with yellow theraband on it to encourage larger steps (06/01/2022);    Time 6    Period Weeks     Status MET   Target Date 06/29/22           Plan    Clinical Impression Statement Patient arrives with rollator and yellow theraband across front two legs of rollator, taking small steps. Session continued to work on interventions to improve gait pattern with limited success increasing step length except with rollator and focused attention. Pateint's sit to stand improved for with further cuing. Fatigues more quickly with L UE and LE. Patient would benefit from continued management of limiting condition by skilled physical therapist to address remaining impairments and functional limitations to work towards stated  goals and return to PLOF or maximal functional independence.      Personal Factors and Comorbidities Past/Current Experience;Behavior Pattern;Time since onset of injury/illness/exacerbation    Examination-Activity Limitations Transfers;Squat;Stand    Examination-Participation Restrictions Community Activity;Driving    Stability/Clinical Decision Making Evolving/Moderate complexity    Rehab Potential Excellent    PT Frequency 2x / week    PT Duration 6 weeks    PT Treatment/Interventions ADLs/Self Care Home Management;Electrical Stimulation;Cryotherapy;Moist Heat;Gait training;DME Instruction;Functional mobility training;Therapeutic activities;Therapeutic exercise;Balance training;Neuromuscular re-education;Patient/family education;Cognitive remediation;Prosthetic Training    PT Next Visit Plan Additional tests and measures as indicated; static and dynamic balance interventions, establish exercises for home    Consulted and Agree with Plan of Care Patient             Cira Rue, PT, DPT 06/06/2022, 11:44 AM  [facility] [address] [place], [place] [address] P: [phone] I F: [phone]

## 2022-06-08 ENCOUNTER — Ambulatory Visit: Payer: Medicare Other | Admitting: Physical Therapy

## 2022-06-08 ENCOUNTER — Encounter: Payer: Self-pay | Admitting: Physical Therapy

## 2022-06-08 DIAGNOSIS — R262 Difficulty in walking, not elsewhere classified: Secondary | ICD-10-CM | POA: Diagnosis not present

## 2022-06-08 DIAGNOSIS — R2681 Unsteadiness on feet: Secondary | ICD-10-CM

## 2022-06-08 NOTE — Therapy (Signed)
OUTPATIENT PHYSICAL THERAPY TREATMENT NOTE   Patient Name: Stacey Jenkins MRN: [id number] DOB:1966-01-11, 57 y.o., female Today's Date: 06/08/2022  PCP: [facility] PROVIDER: Jennings Books, MD (neurology)  END OF SESSION:  PT End of Session - 06/08/22 0956     Visit Number 6    Number of Visits 12    Date for PT Re-Evaluation 06/29/22    Authorization Type [employer] Medicare reporting period from 05/18/2022    Progress Note Due on Visit 10    PT Start Time 1038    PT Stop Time 1120    PT Time Calculation (min) 42 min    Equipment Utilized During Treatment Gait belt    Activity Tolerance Patient tolerated treatment well    Behavior During Therapy [facility] for tasks assessed/performed               History reviewed. No pertinent past medical history. History reviewed. No pertinent surgical history. There are no problems to display for this patient.   REFERRING DIAG: chronic pain in right knee (MD note says order Physical Therapy for ambulation/balance in a patient with multiple sclerosis).   THERAPY DIAG:  Difficulty in walking, not elsewhere classified  Unsteadiness on feet  Rationale for Evaluation and Treatment: Rehabilitation  PERTINENT HISTORY: Stacey Jenkins is a 42yoF diagnosed with MS at age 57, has some intermittent diplopia, some LUE paresthesia, LLE motor and sensory involvement mostly, used [employer] A[facility] for several years, uses [facility] or [facility] as needed. Pt still drives, AMB in community, has local family support, no recent falls due to safety awareness and risk awareness. 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   SUBJECTIVE:   SUBJECTIVE STATEMENT: Patient arrives with rollator walker with yellow theraband on the back legs. She reports she felt okay after last PT session. She reports no falls since last PT session. She states she is doing her HEP 3x a [name].   PAIN:  Are you having pain? NPRS 4-5/10 R  knee  OBJECTIVE  TODAY'S TREATMENT:   Neuromuscular Re-education: to improve, balance, postural strength, muscle activation patterns, and stabilization strength required for functional activities: - ambulation over ground with rollator with yellow theraband around back walker legs to improve step length. Metronome used at 100 bpm to help improve cadence during larger steps. SBA. 500 feet in 6 min.  - forward deadmill attempt (patient unable to start or maintain belt motion second to poor coordination and strength).  - standing loaded cart push (310 lbs loaded on cart), 6x 30 feet with PT assisting with guiding cart straight and providing some push back as appropriate. Cuing for improved step length.  - forward deadmill attempt: able to take some steps with PT assisting with hands on belt behind patient. Patient continues to have difficulty starting or maintaining motion on the belt.    Therapeutic exercise: to centralize symptoms and improve ROM, strength, muscular endurance, and activity tolerance required for successful completion of functional activities.  - sit to stand from 17 inch chair with overhead press, 3x10 with 6# DB in each hand. Added YTB around knees to help improve valgus position of R > L especially in sitting.  - standing KB deadlift in front of chair with KB to floor between feet, 3x10 with 20/30/30#KB.   Pt required multimodal cuing for proper technique and to facilitate improved neuromuscular control, strength, range of motion, and functional ability resulting in improved performance  and form.  PATIENT EDUCATION: Education details: Exercise purpose/form. Self management techniques. Education on HEP including handout. Person educated: Patient Education method: Explanation, Demonstration, Tactile cues, Verbal cues, and Handouts Education comprehension: verbalized understanding, returned demonstration, verbal cues required, tactile cues required, and needs further  education  HOME EXERCISE PROGRAM: Access Code: [email or web address] URL: [email or web address] Date: 05/24/2022 Prepared by: Rosita Kea  Exercises - Sit to Stand Without Arm Support  - 1 x daily - 3 sets - 10 reps - Side Stepping with Counter Support  - 1 x daily - 3 sets - 10 reps - Seated Neck Sidebending ROM  - 1 x daily - 3 sets - 10 reps   PT Short Term Goals       PT SHORT TERM GOAL #1   Title Patient to demonstrate adherence to HEP for improved and community ambulation.    Baseline HEP assignment pending (05/18/2022); initial HEP provided at visit 2 (05/24/2022);    Time 4    Period Weeks    Status In-progress   Target Date 06/15/22      PT SHORT TERM GOAL #2   Title Patient demonstrated 5 times sit to stand in less than 13 seconds and 3    Baseline 15 seconds without hands (05/18/2022);   Time 4    Period  Weeks   Status In-progress   Target Date 06/15/22              PT Long Term Goals       PT LONG TERM GOAL #1   Title Patient to demonstrate 3 point met or greater on mini best test to indicate reduced fall risk    Baseline 11/28 (05/18/22);   Time 6    Period Weeks    Status In-progress   Target Date 06/29/22      PT LONG TERM GOAL #2   Title Patient to demonstrate improved tug test less than 18 seconds to indicate based risk of falls    Baseline Eval: 24 seconds no device (05/18/22);   Time 6    Period Weeks    Status In progress   Target Date 06/29/22      PT LONG TERM GOAL #3   Title Patient to demonstrate 10 m walk test greater than 0.5 meters/sec to indicate treatment in stride length and reduction of shuffling    Baseline 0.28 m/s  (05/18/22);   Time 6    Period Weeks    Status In-progress   Target Date 06/29/22      PT LONG TERM GOAL #4   Title Patient did demonstrate 6-minute walk test distance greater than 500 feet to demonstrate improved capacity related in the community    Baseline eval: 330ft c RUE [facility]  (05/18/22); 536 feet with  rollater with yellow theraband on it to encourage larger steps (06/01/2022);    Time 6    Period Weeks    Status MET   Target Date 06/29/22           Plan    Clinical Impression Statement Patient arrives with rollator and yellow theraband across front two legs of rollator, taking small steps. Continued with interventions to help improve step length with appropriate cadence during ambulation including pushing activities that require large steps. Patient was unable to coordinate deadmill exercise effectively but did better with pushing a loaded cart. Ultimately, patient continues to use short steps when exiting the clinic. Patient also requires cuing each time she completes squat and dead lift to  place feet appropriately. She tends to use rihgt LE more than left. Patient would benefit from continued management of limiting condition by skilled physical therapist to address remaining impairments and functional limitations to work towards stated goals and return to PLOF or maximal functional independence.     Personal Factors and Comorbidities Past/Current Experience;Behavior Pattern;Time since onset of injury/illness/exacerbation    Examination-Activity Limitations Transfers;Squat;Stand    Examination-Participation Restrictions Community Activity;Driving    Stability/Clinical Decision Making Evolving/Moderate complexity    Rehab Potential Excellent    PT Frequency 2x / week    PT Duration 6 weeks    PT Treatment/Interventions ADLs/Self Care Home Management;Electrical Stimulation;Cryotherapy;Moist Heat;Gait training;DME Instruction;Functional mobility training;Therapeutic activities;Therapeutic exercise;Balance training;Neuromuscular re-education;Patient/family education;Cognitive remediation;Prosthetic Training    PT Next Visit Plan Additional tests and measures as indicated; static and dynamic balance interventions, establish exercises for home    Consulted and Agree with Plan of Care Patient              Nancy Nordmann, PT, DPT 06/08/2022, 11:39 AM  [facility] [address] [place], [place] [address] P: [phone] I F: [phone]

## 2022-06-13 ENCOUNTER — Ambulatory Visit: Payer: Medicare Other | Attending: Neurology | Admitting: Physical Therapy

## 2022-06-13 ENCOUNTER — Encounter: Payer: Self-pay | Admitting: Physical Therapy

## 2022-06-13 DIAGNOSIS — R262 Difficulty in walking, not elsewhere classified: Secondary | ICD-10-CM | POA: Insufficient documentation

## 2022-06-13 DIAGNOSIS — R2681 Unsteadiness on feet: Secondary | ICD-10-CM | POA: Diagnosis present

## 2022-06-13 NOTE — Therapy (Signed)
OUTPATIENT PHYSICAL THERAPY TREATMENT NOTE   Patient Name: Stacey Jenkins MRN: [id number] DOB:[birth year], 57 y.o., female Today's Date: 06/13/2022  PCP: [place] PROVIDER: Jennings Books, MD (neurology)  END OF SESSION:  PT End of Session - 06/13/22 1140     Visit Number 7    Number of Visits 12    Date for PT Re-Evaluation 06/29/22    Authorization Type [employer] Medicare reporting period from 05/18/2022    Progress Note Due on Visit 10    PT Start Time 1030    PT Stop Time 1115    PT Time Calculation (min) 45 min    Equipment Utilized During Treatment Gait belt    Activity Tolerance Patient tolerated treatment well    Behavior During Therapy [facility] for tasks assessed/performed               History reviewed. No pertinent past medical history. History reviewed. No pertinent surgical history. There are no problems to display for this patient.   REFERRING DIAG: chronic pain in right knee (MD note says order Physical Therapy for ambulation/balance in a patient with multiple sclerosis).   THERAPY DIAG:  Difficulty in walking, not elsewhere classified  Unsteadiness on feet  Rationale for Evaluation and Treatment: Rehabilitation  PERTINENT HISTORY: Stacey Jenkins is a 39yoF diagnosed with MS at age 67, has some intermittent diplopia, some LUE paresthesia, LLE motor and sensory involvement mostly, used [employer] AFO for several years, uses [facility] or [facility] as needed. Pt still drives, AMB in community, has local family support, no recent falls due to safety awareness and risk awareness. 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   SUBJECTIVE:   SUBJECTIVE STATEMENT: Patient reports she was a little sore in her her arms after last PT session. She has no pain currently. She reports no falls since last PT session. She reports her HEP is going well. She arrives with rollator.   PAIN:  Are you having pain? NPRS  0/10  OBJECTIVE  TODAY'S TREATMENT:   Neuromuscular Re-education: to improve, balance, postural strength, muscle activation patterns, and stabilization strength required for functional activities: - Treadmill 0.6 mph at 0% grade with B UE support on front bar. With CGA and cuing for improved step length. Focusing on improving gait quality and reducing shuffling. 6  minutes. Required total assistance to set up and operate machine.  - step up and over 8 inch aerobic step, progressing to with no UE support. CGA for safety, 1x20 times over leading with each foot. Decreased to 6 inch step half way through L LE lead due to fear with step up without UE support.   Therapeutic exercise: to centralize symptoms and improve ROM, strength, muscular endurance, and activity tolerance required for successful completion of functional activities.  - sit to stand from 18 inch chair with overhead press, 3x10 with 6# DB in each hand. YTB around knees to help improve valgus position. Small half spike ball then 4 inch yoga block under R foot to improve weight bearing and use of L LE. (Improved balance with yoga block vs half spike ball).  - standing KB deadlift in front of chair with KB to floor between feet, R foot on 4 inch yoga block to improve use of L LE,  3x10 with 20/30/30#KB.   Pt required multimodal cuing for proper technique and to facilitate improved neuromuscular control, strength, range of motion, and functional ability resulting  in improved performance and form.  PATIENT EDUCATION: Education details: Exercise purpose/form. Self management techniques. Education on HEP including handout. Person educated: Patient Education method: Explanation, Demonstration, Tactile cues, Verbal cues, and Handouts Education comprehension: verbalized understanding, returned demonstration, verbal cues required, tactile cues required, and needs further education  HOME EXERCISE PROGRAM: Access Code: [email or web address] URL:  [email or web address] Date: 05/24/2022 Prepared by: Rosita Kea  Exercises - Sit to Stand Without Arm Support  - 1 x daily - 3 sets - 10 reps - Side Stepping with Counter Support  - 1 x daily - 3 sets - 10 reps - Seated Neck Sidebending ROM  - 1 x daily - 3 sets - 10 reps   PT Short Term Goals       PT SHORT TERM GOAL #1   Title Patient to demonstrate adherence to HEP for improved and community ambulation.    Baseline HEP assignment pending (05/18/2022); initial HEP provided at visit 2 (05/24/2022);    Time 4    Period Weeks    Status In-progress   Target Date 06/15/22      PT SHORT TERM GOAL #2   Title Patient demonstrated 5 times sit to stand in less than 13 seconds and 3    Baseline 15 seconds without hands (05/18/2022);   Time 4    Period  Weeks   Status In-progress   Target Date 06/15/22              PT Long Term Goals       PT LONG TERM GOAL #1   Title Patient to demonstrate 3 point met or greater on mini best test to indicate reduced fall risk    Baseline 11/28 (05/18/22);   Time 6    Period Weeks    Status In-progress   Target Date 06/29/22      PT LONG TERM GOAL #2   Title Patient to demonstrate improved tug test less than 18 seconds to indicate based risk of falls    Baseline Eval: 24 seconds no device (05/18/22);   Time 6    Period Weeks    Status In progress   Target Date 06/29/22      PT LONG TERM GOAL #3   Title Patient to demonstrate 10 m walk test greater than 0.5 meters/sec to indicate treatment in stride length and reduction of shuffling    Baseline 0.28 m/s  (05/18/22);   Time 6    Period Weeks    Status In-progress   Target Date 06/29/22      PT LONG TERM GOAL #4   Title Patient did demonstrate 6-minute walk test distance greater than 500 feet to demonstrate improved capacity related in the community    Baseline eval: 361ft c RUE [facility]  (05/18/22); 536 feet with rollater with yellow theraband on it to encourage larger steps  (06/01/2022);    Time 6    Period Weeks    Status MET   Target Date 06/29/22           Plan    Clinical Impression Statement Patient arrives with rollator and yellow theraband across front two legs of rollator, continuing to take small steps. Today's session focused on use of TM to improve gait pattern and practice stepping up and over step leading with each foot to improve gait dynamics. Patient very fearful of stepping up with left LE related to LE strength and imbalance. Progressed squat and dead lift exercise to include lift under R LE to encourage  more load on L LE to preference it for strengthening. Overall patient tolerated treatment well. She did bump the 20#KB on her right knee when doing deadlifts but stated she felt okay to continue and  did not show signs of injury. Patient would benefit from continued management of limiting condition by skilled physical therapist to address remaining impairments and functional limitations to work towards stated goals and return to PLOF or maximal functional independence.  .     Personal Factors and Comorbidities Past/Current Experience;Behavior Pattern;Time since onset of injury/illness/exacerbation    Examination-Activity Limitations Transfers;Squat;Stand    Examination-Participation Restrictions Community Activity;Driving    Stability/Clinical Decision Making Evolving/Moderate complexity    Rehab Potential Excellent    PT Frequency 2x / week    PT Duration 6 weeks    PT Treatment/Interventions ADLs/Self Care Home Management;Electrical Stimulation;Cryotherapy;Moist Heat;Gait training;DME Instruction;Functional mobility training;Therapeutic activities;Therapeutic exercise;Balance training;Neuromuscular re-education;Patient/family education;Cognitive remediation;Prosthetic Training    PT Next Visit Plan Additional tests and measures as indicated; static and dynamic balance interventions, establish exercises for home    Consulted and Agree with Plan  of Care Patient             Nancy Nordmann, PT, DPT 06/13/2022, 11:42 AM  [facility] [address] [place], [place] [address] P: [phone] I F: [phone]

## 2022-06-15 ENCOUNTER — Ambulatory Visit: Payer: Medicare Other | Admitting: Physical Therapy

## 2022-06-15 ENCOUNTER — Encounter: Payer: Self-pay | Admitting: Physical Therapy

## 2022-06-15 DIAGNOSIS — R2681 Unsteadiness on feet: Secondary | ICD-10-CM

## 2022-06-15 DIAGNOSIS — R262 Difficulty in walking, not elsewhere classified: Secondary | ICD-10-CM

## 2022-06-15 NOTE — Therapy (Signed)
OUTPATIENT PHYSICAL THERAPY TREATMENT NOTE   Patient Name: Stacey Jenkins MRN: [id number] DOB:March 01, 1966, 57 y.o., female Today's Date: 06/15/2022  PCP: [place] PROVIDER: Jennings Books, MD (neurology)  END OF SESSION:  PT End of Session - 06/15/22 1034     Visit Number 8    Number of Visits 12    Date for PT Re-Evaluation 06/29/22    Authorization Type [employer] Medicare reporting period from 05/18/2022    Progress Note Due on Visit 10    PT Start Time 1034    PT Stop Time 1125    PT Time Calculation (min) 51 min    Equipment Utilized During Treatment Gait belt    Activity Tolerance Patient tolerated treatment well    Behavior During Therapy [facility] for tasks assessed/performed               History reviewed. No pertinent past medical history. History reviewed. No pertinent surgical history. There are no problems to display for this patient.   REFERRING DIAG: chronic pain in right knee (MD note says order Physical Therapy for ambulation/balance in a patient with multiple sclerosis).   THERAPY DIAG:  Difficulty in walking, not elsewhere classified  Unsteadiness on feet  Rationale for Evaluation and Treatment: Rehabilitation  PERTINENT HISTORY: Stacey Jenkins is a 52yoF diagnosed with MS at age 70, has some intermittent diplopia, some LUE paresthesia, LLE motor and sensory involvement mostly, used [employer] [facility] for several years, uses [facility] or [facility] as needed. Pt still drives, AMB in community, has local family support, no recent falls due to safety awareness and risk awareness. 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   SUBJECTIVE:   SUBJECTIVE STATEMENT: Patient reports she is feeling well today. She arrives with rollator. She states her B knees were a little sore after last PT session and her back is hurting a little upon arrival. She states "don't work me out too hard" but when questioned more states she just wanted  to say that but has no specific reason.   PAIN:  Are you having pain? NPRS 4-5/10  OBJECTIVE  TODAY'S TREATMENT:   Neuromuscular Re-education: to improve, balance, postural strength, muscle activation patterns, and stabilization strength required for functional activities: - Treadmill 0.7 mph at 0% grade with B UE support on front bar. With CGA and cuing for improved step length. Focusing on improving gait quality and reducing shuffling. 6  minutes. Required total assistance to set up and operate machine.  - step up and over 8 inch aerobic step, progressing to with no UE support. CGA for safety, 1x20 times over leading with each foot.   Therapeutic exercise: to centralize symptoms and improve ROM, strength, muscular endurance, and activity tolerance required for successful completion of functional activities.  - Recumbent bike seat position 14 and level 5, 7 min as a trial before patient purchases home recumbent bike. Needed extra strap at left foot to prevent foot from falling out. PT provided with strapping material from clinic.  - sit to stand from 18 inch chair with overhead press, 3x10 with 6# DB in each hand. YTB around knees to help improve valgus position. 4 inch yoga block under R foot to improve weight bearing and use of L LE.  - standing KB deadlift in front of chair with KB to floor between feet, R foot on 4 inch yoga block to improve use of L LE,  3x10 with  30#KB.   Pt required multimodal cuing for proper technique and to facilitate improved neuromuscular control, strength, range of motion, and functional ability resulting in improved performance and form.  PATIENT EDUCATION: Education details: Exercise purpose/form. Self management techniques. Education on HEP including handout. Person educated: Patient Education method: Explanation, Demonstration, Tactile cues, Verbal cues, and Handouts Education comprehension: verbalized understanding, returned demonstration, verbal cues  required, tactile cues required, and needs further education  HOME EXERCISE PROGRAM: Access Code: [email or web address] URL: [email or web address] Date: 05/24/2022 Prepared by: Rosita Kea  Exercises - Sit to Stand Without Arm Support  - 1 x daily - 3 sets - 10 reps - Side Stepping with Counter Support  - 1 x daily - 3 sets - 10 reps - Seated Neck Sidebending ROM  - 1 x daily - 3 sets - 10 reps   PT Short Term Goals       PT SHORT TERM GOAL #1   Title Patient to demonstrate adherence to HEP for improved and community ambulation.    Baseline HEP assignment pending (05/18/2022); initial HEP provided at visit 2 (05/24/2022);    Time 4    Period Weeks    Status In-progress   Target Date 06/15/22      PT SHORT TERM GOAL #2   Title Patient demonstrated 5 times sit to stand in less than 13 seconds and 3    Baseline 15 seconds without hands (05/18/2022);   Time 4    Period  Weeks   Status In-progress   Target Date 06/15/22              PT Long Term Goals       PT LONG TERM GOAL #1   Title Patient to demonstrate 3 point met or greater on mini best test to indicate reduced fall risk    Baseline 11/28 (05/18/22);   Time 6    Period Weeks    Status In-progress   Target Date 06/29/22      PT LONG TERM GOAL #2   Title Patient to demonstrate improved tug test less than 18 seconds to indicate based risk of falls    Baseline Eval: 24 seconds no device (05/18/22);   Time 6    Period Weeks    Status In progress   Target Date 06/29/22      PT LONG TERM GOAL #3   Title Patient to demonstrate 10 m walk test greater than 0.5 meters/sec to indicate treatment in stride length and reduction of shuffling    Baseline 0.28 m/s  (05/18/22);   Time 6    Period Weeks    Status In-progress   Target Date 06/29/22      PT LONG TERM GOAL #4   Title Patient did demonstrate 6-minute walk test distance greater than 500 feet to demonstrate improved capacity related in the community    Baseline  eval: 385ft c RUE [facility]  (05/18/22); 536 feet with rollater with yellow theraband on it to encourage larger steps (06/01/2022);    Time 6    Period Weeks    Status MET   Target Date 06/29/22           Plan    Clinical Impression Statement Patient arrives with rollator and yellow theraband across front two legs of rollator, reporting some low back pain. She continued with interventions to help improve gait pattern on treadmill and with step up exercise. She was able to progress to higher step with the L LE as well as  increase weight on dead lift. Patient very interested in discussing how she can exercise at home or at the gym long term. Plan to update HEP for long term use next session. Patient continues to demonstrate shuffling gait without explicit cuing and attention to taking larger steps. Patient would benefit from continued management of limiting condition by skilled physical therapist to address remaining impairments and functional limitations to work towards stated goals and return to PLOF or maximal functional independence.    Personal Factors and Comorbidities Past/Current Experience;Behavior Pattern;Time since onset of injury/illness/exacerbation    Examination-Activity Limitations Transfers;Squat;Stand    Examination-Participation Restrictions Community Activity;Driving    Stability/Clinical Decision Making Evolving/Moderate complexity    Rehab Potential Excellent    PT Frequency 2x / week    PT Duration 6 weeks    PT Treatment/Interventions ADLs/Self Care Home Management;Electrical Stimulation;Cryotherapy;Moist Heat;Gait training;DME Instruction;Functional mobility training;Therapeutic activities;Therapeutic exercise;Balance training;Neuromuscular re-education;Patient/family education;Cognitive remediation;Prosthetic Training    PT Next Visit Plan Additional tests and measures as indicated; static and dynamic balance interventions, establish exercises for home    Consulted and Agree  with Plan of Care Patient             Nancy Nordmann, PT, DPT 06/15/2022, 11:35 AM  [facility] [address] [place], [place] [address] P: [phone] I F: [phone]

## 2022-06-20 ENCOUNTER — Ambulatory Visit: Payer: Medicare Other | Admitting: Physical Therapy

## 2022-06-20 ENCOUNTER — Encounter: Payer: Self-pay | Admitting: Physical Therapy

## 2022-06-20 DIAGNOSIS — R2681 Unsteadiness on feet: Secondary | ICD-10-CM

## 2022-06-20 DIAGNOSIS — R262 Difficulty in walking, not elsewhere classified: Secondary | ICD-10-CM

## 2022-06-20 NOTE — Therapy (Signed)
OUTPATIENT PHYSICAL THERAPY TREATMENT NOTE   Patient Name: Stacey Jenkins MRN: [id number] DOB:[birth year], 57 y.o., female Today's Date: 06/20/2022  PCP: [place] PROVIDER: Jennings Books, MD (neurology)  END OF SESSION:  PT End of Session - 06/20/22 1033     Visit Number 9    Number of Visits 12    Date for PT Re-Evaluation 06/29/22    Authorization Type [employer] Medicare reporting period from 05/18/2022    Progress Note Due on Visit 10    PT Start Time 1033    PT Stop Time 1111    PT Time Calculation (min) 38 min    Equipment Utilized During Treatment Gait belt    Activity Tolerance Patient tolerated treatment well    Behavior During Therapy [facility] for tasks assessed/performed             History reviewed. No pertinent past medical history. History reviewed. No pertinent surgical history. There are no problems to display for this patient.   REFERRING DIAG: chronic pain in right knee (MD note says order Physical Therapy for ambulation/balance in a patient with multiple sclerosis).   THERAPY DIAG:  Difficulty in walking, not elsewhere classified  Unsteadiness on feet  Rationale for Evaluation and Treatment: Rehabilitation  PERTINENT HISTORY: Stacey Jenkins is a 53yoF diagnosed with MS at age 1, has some intermittent diplopia, some LUE paresthesia, LLE motor and sensory involvement mostly, used [employer] A[employer] for several years, uses [facility] or [facility] as needed. Pt still drives, AMB in community, has local family support, no recent falls due to safety awareness and risk awareness. 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   SUBJECTIVE:   SUBJECTIVE STATEMENT: Patient reports she is doing well today. She states she feels like she is walking in longer strides. She states her HEP is going well. She arrives with her rollater with the yellow theraband across the back legs. She states she is thinking about going to a gym, because  her insurance will pay for it, but she doesn't want to exercise with other people. She is not interested in group classes.   PAIN:  Are you having pain? NPRS 6-7/10 right knee  OBJECTIVE  TODAY'S TREATMENT:   Therapeutic exercise: to centralize symptoms and improve ROM, strength, muscular endurance, and activity tolerance required for successful completion of functional activities.  - sit to stand from 18 inch chair with overhead press, 3x10 with 5# DB in each hand.  - standing dead lift in front of chair with pillow case filled with 2 five-lb dumbbells to floor between feet, 3x10 - lateral step over shoe box, B UE support, 5# AW on each foot, 3x10 each direction, seated rest between sets, practiced putting on AW with min cuing.   - forwards step up to 8 inch stool with B UE support in front of body. 2x10 each side with 5#AW on each foot.  - Education on HEP including handout   Pt required multimodal cuing for proper technique and to facilitate improved neuromuscular control, strength, range of motion, and functional ability resulting in improved performance and form.  PATIENT EDUCATION: Education details: Exercise purpose/form. Self management techniques. Education on HEP including handout. Person educated: Patient Education method: Explanation, Demonstration, Tactile cues, Verbal cues, and Handouts Education comprehension: verbalized understanding, returned demonstration, verbal cues required, tactile cues required, and needs further education  HOME EXERCISE PROGRAM: Access Code: [email or web address] URL: [email or web address] Date: 06/20/2022 Prepared by:  Rosita Kea  Exercises - Recumbent Bike  - 3-5 x weekly - 30 min time - Seated Neck Sidebending ROM  - 1 x daily - 3 sets - 10 reps - Kettlebell Deadlift  - 3 x weekly - 3 sets - 10 reps - Forward Step Up with Counter Support  - 3 x weekly - 3 sets - 10 reps  HOME EXERCISE PROGRAM [[email or web address]] View at "[email or web address]"  using code: [email or web address]  THRUSTER -  Repeat 10 Repetitions, Complete 3 Sets, Perform 3 Times a Week  STEP OVER OBJECT SIDEWAYS -  Repeat 10 Repetitions, Complete 3 Sets, Perform 3 Times a Week   PT Short Term Goals       PT SHORT TERM GOAL #1   Title Patient to demonstrate adherence to HEP for improved and community ambulation.    Baseline HEP assignment pending (05/18/2022); initial HEP provided at visit 2 (05/24/2022);    Time 4    Period Weeks    Status In-progress   Target Date 06/15/22      PT SHORT TERM GOAL #2   Title Patient demonstrated 5 times sit to stand in less than 13 seconds and 3    Baseline 15 seconds without hands (05/18/2022);   Time 4    Period  Weeks   Status In-progress   Target Date 06/15/22              PT Long Term Goals       PT LONG TERM GOAL #1   Title Patient to demonstrate 3 point met or greater on mini best test to indicate reduced fall risk    Baseline 11/28 (05/18/22);   Time 6    Period Weeks    Status In-progress   Target Date 06/29/22      PT LONG TERM GOAL #2   Title Patient to demonstrate improved tug test less than 18 seconds to indicate based risk of falls    Baseline Eval: 24 seconds no device (05/18/22);   Time 6    Period Weeks    Status In progress   Target Date 06/29/22      PT LONG TERM GOAL #3   Title Patient to demonstrate 10 m walk test greater than 0.5 meters/sec to indicate treatment in stride length and reduction of shuffling    Baseline 0.28 m/s  (05/18/22);   Time 6    Period Weeks    Status In-progress   Target Date 06/29/22      PT LONG TERM GOAL #4   Title Patient did demonstrate 6-minute walk test distance greater than 500 feet to demonstrate improved capacity related in the community    Baseline eval: 366f c RUE [facility]  (05/18/22); 536 feet with rollater with yellow theraband on it to encourage larger steps (06/01/2022);    Time 6    Period Weeks    Status MET   Target Date 06/29/22           Plan     Clinical Impression Statement Patient arrives with rollator and yellow theraband across front two legs of rollator, reporting some R knee pain. Today's session focused on updating HEP to long term use in anticipation of discharge next session. Patient was able to complete exercises with min-mod cuing. She reported feeling she could complete these exercises safely at home and her form was acceptable to benefit from exercises  She would benefit from review and any needed updates to HEP next session. She continues to have difficulty  with motor control and gait pattern, taking small shuffling steps without concentration on her form. Patient would benefit from continued management of limiting condition by skilled physical therapist to address remaining impairments and functional limitations to work towards stated goals and return to PLOF or maximal functional independence.     Personal Factors and Comorbidities Past/Current Experience;Behavior Pattern;Time since onset of injury/illness/exacerbation    Examination-Activity Limitations Transfers;Squat;Stand    Examination-Participation Restrictions Community Activity;Driving    Stability/Clinical Decision Making Evolving/Moderate complexity    Rehab Potential Excellent    PT Frequency 2x / week    PT Duration 6 weeks    PT Treatment/Interventions ADLs/Self Care Home Management;Electrical Stimulation;Cryotherapy;Moist Heat;Gait training;DME Instruction;Functional mobility training;Therapeutic activities;Therapeutic exercise;Balance training;Neuromuscular re-education;Patient/family education;Cognitive remediation;Prosthetic Training    PT Next Visit Plan Additional tests and measures as indicated; static and dynamic balance interventions, establish exercises for home    Consulted and Agree with Plan of Care Patient             Nancy Nordmann, PT, DPT 06/20/2022, [facility] [address] [place], [place]  [address] P: [phone] I F: [phone]

## 2022-06-29 ENCOUNTER — Ambulatory Visit: Payer: Medicare Other | Admitting: Physical Therapy
